# Patient Record
Sex: Male | Born: 1950 | Race: Black or African American | Marital: Married | Smoking: Never smoker
Health system: Southern US, Community
[De-identification: ages and names within clinical notes are randomized; demographics above are authoritative.]

## PROBLEM LIST (undated history)

## (undated) DIAGNOSIS — R7303 Prediabetes: Secondary | ICD-10-CM

## (undated) DIAGNOSIS — I10 Essential (primary) hypertension: Secondary | ICD-10-CM

## (undated) DIAGNOSIS — M1990 Unspecified osteoarthritis, unspecified site: Secondary | ICD-10-CM

## (undated) DIAGNOSIS — K759 Inflammatory liver disease, unspecified: Secondary | ICD-10-CM

## (undated) HISTORY — PX: COLONOSCOPY W/ POLYPECTOMY: SHX1380

## (undated) HISTORY — PX: OTHER SURGICAL HISTORY: SHX169

## (undated) HISTORY — PX: FRACTURE SURGERY: SHX138

## (undated) HISTORY — PX: SHOULDER OPEN ROTATOR CUFF REPAIR: SHX2407

---

## 2004-08-30 ENCOUNTER — Ambulatory Visit: Payer: Self-pay | Admitting: Internal Medicine

## 2004-10-31 ENCOUNTER — Ambulatory Visit: Payer: Self-pay | Admitting: Family Medicine

## 2004-11-10 ENCOUNTER — Ambulatory Visit: Payer: Self-pay | Admitting: Family Medicine

## 2004-11-17 ENCOUNTER — Ambulatory Visit: Payer: Self-pay | Admitting: Gastroenterology

## 2005-12-06 ENCOUNTER — Ambulatory Visit: Payer: Self-pay | Admitting: Unknown Physician Specialty

## 2009-02-08 ENCOUNTER — Ambulatory Visit: Payer: Self-pay | Admitting: General Practice

## 2009-04-26 ENCOUNTER — Ambulatory Visit: Payer: Self-pay

## 2010-09-30 IMAGING — CR DG LUMBAR SPINE 2-3V
1 series · 3 of 3 positions shown · non-contrast
Comparison: none

REASON FOR EXAM: a torn rotator cuff w/ rt shoulder  [facility] fax [phone]
COMMENTS:

[Series 1: view not recorded · 0.17mm/px · 3 of 3 slices shown]
[im 1/3]
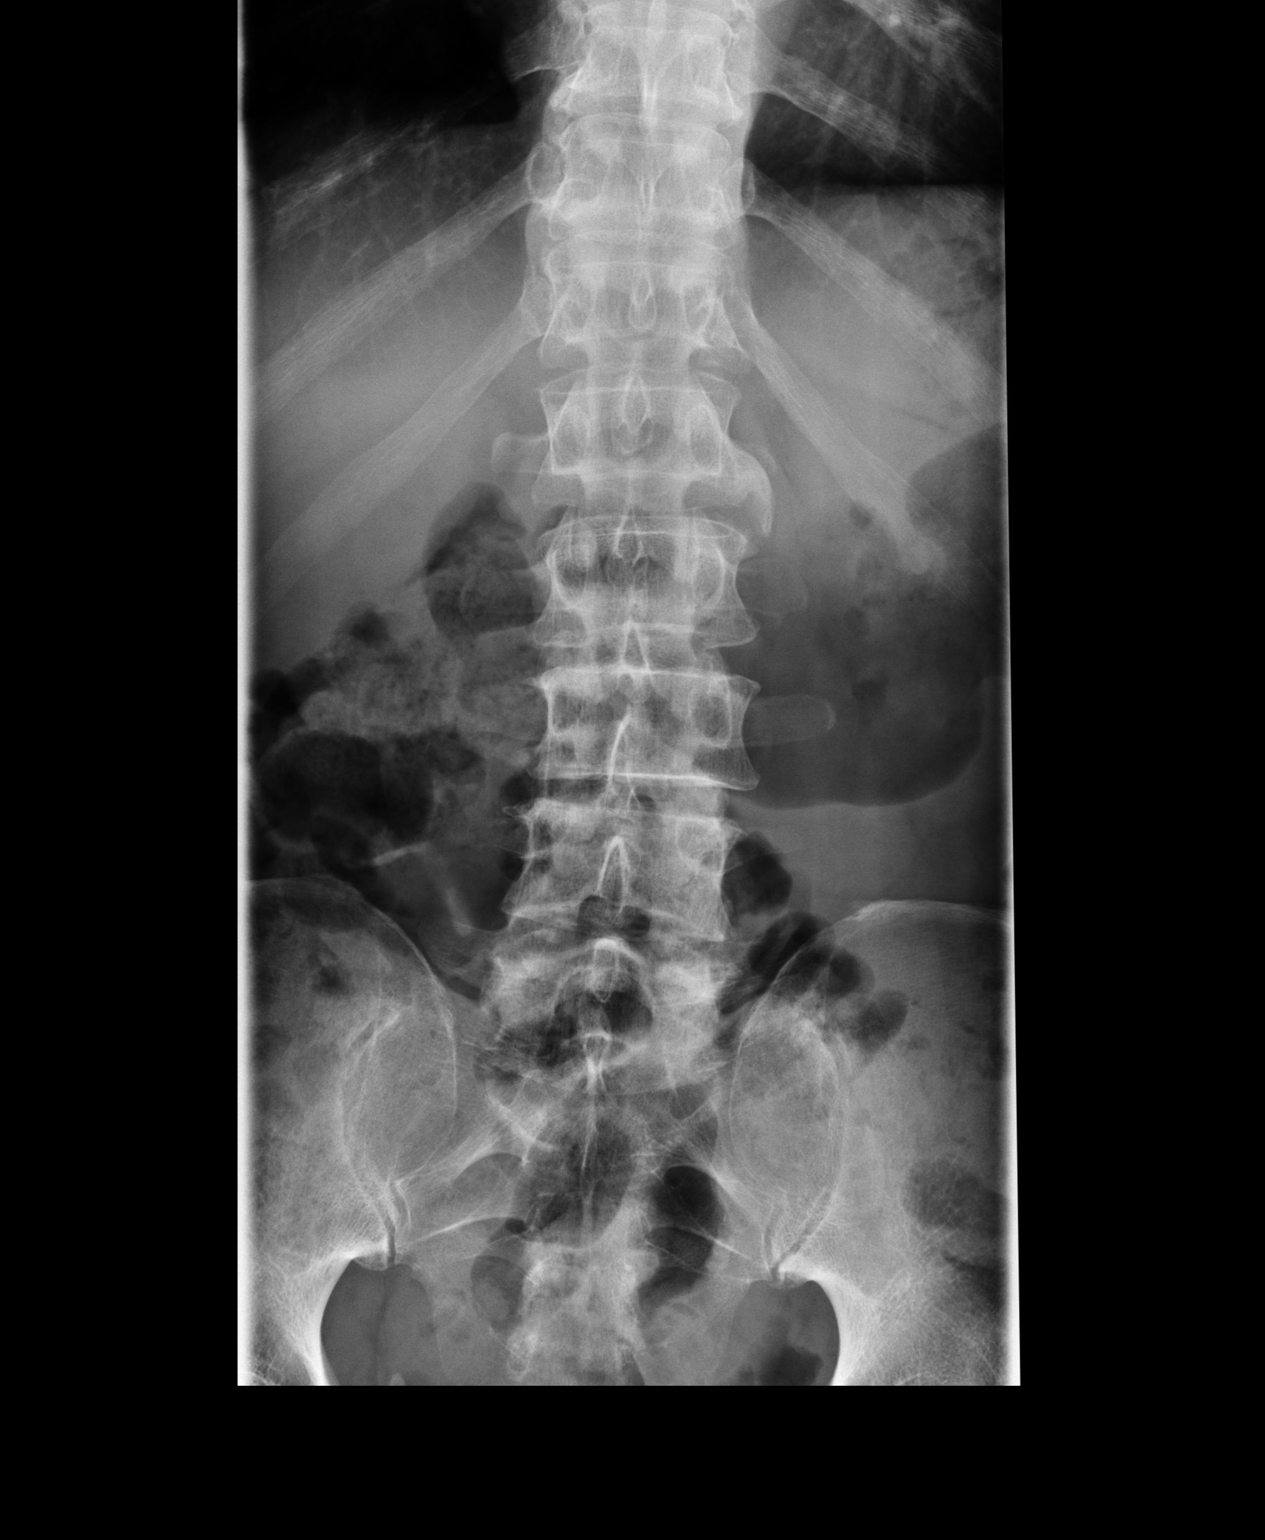
[im 2/3]
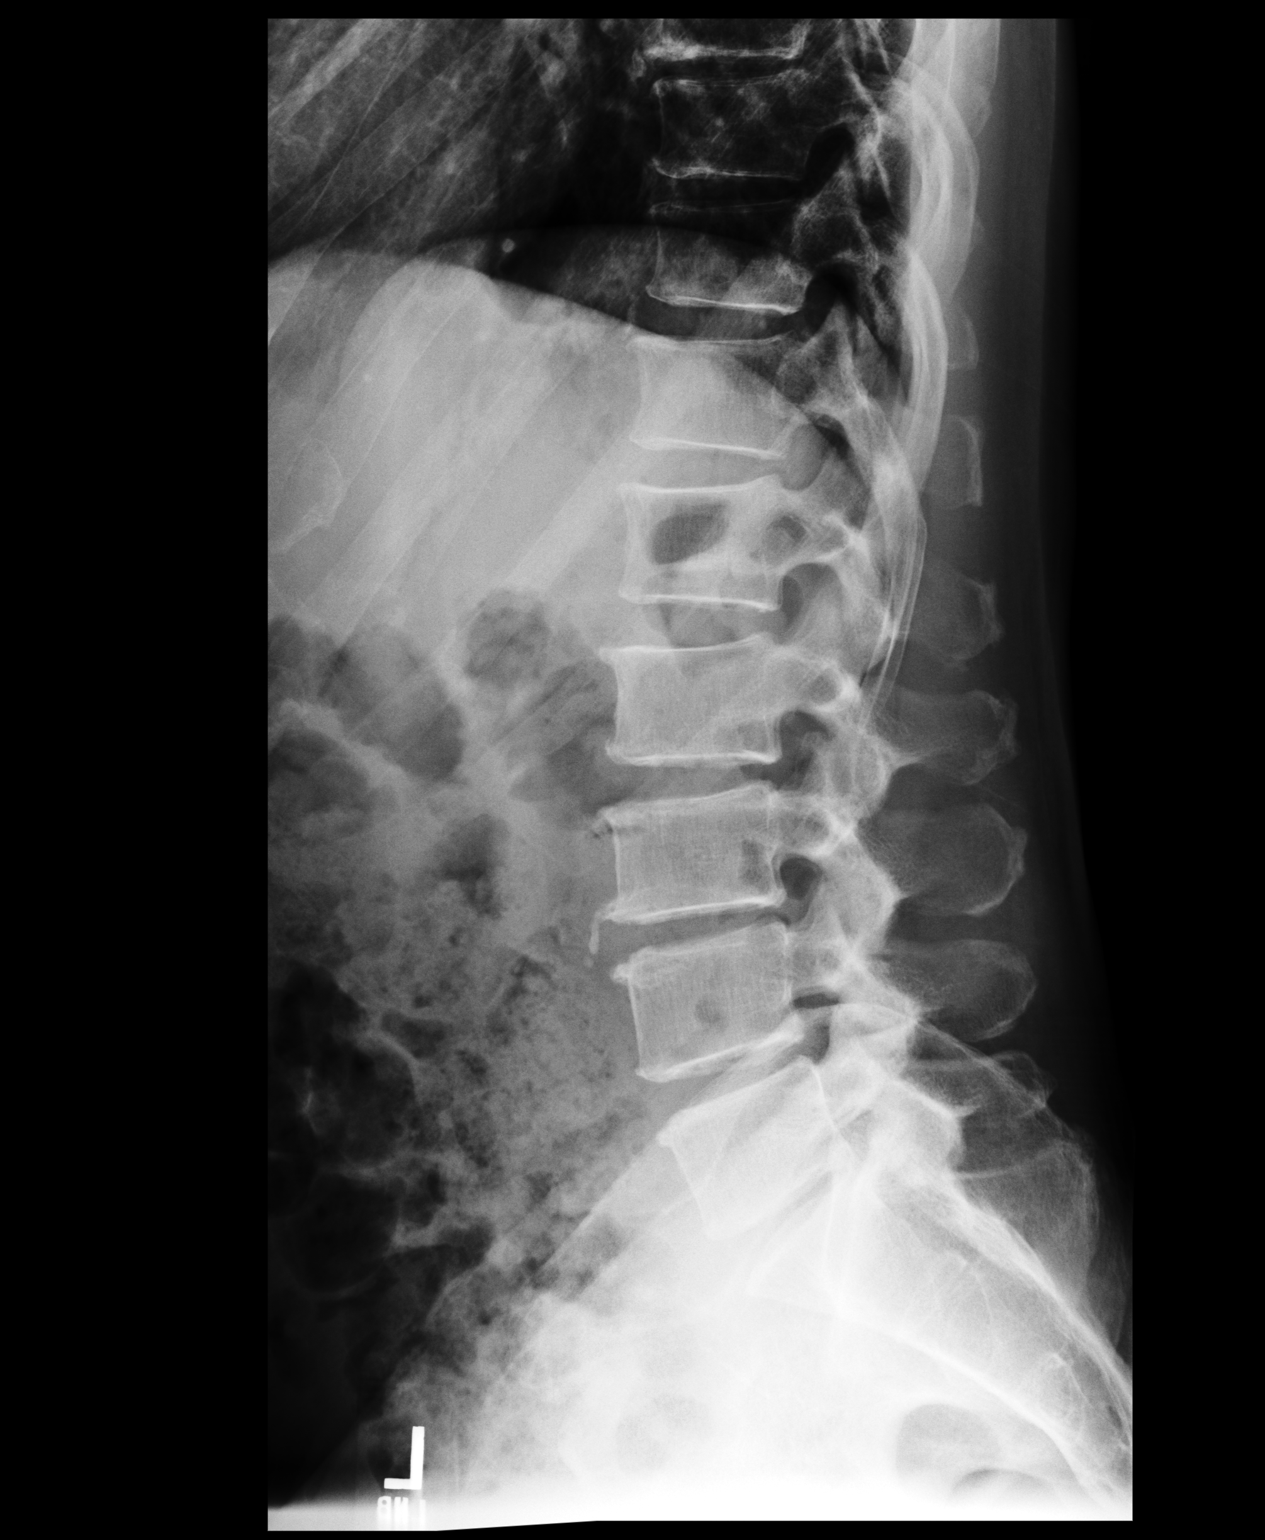
[im 3/3]
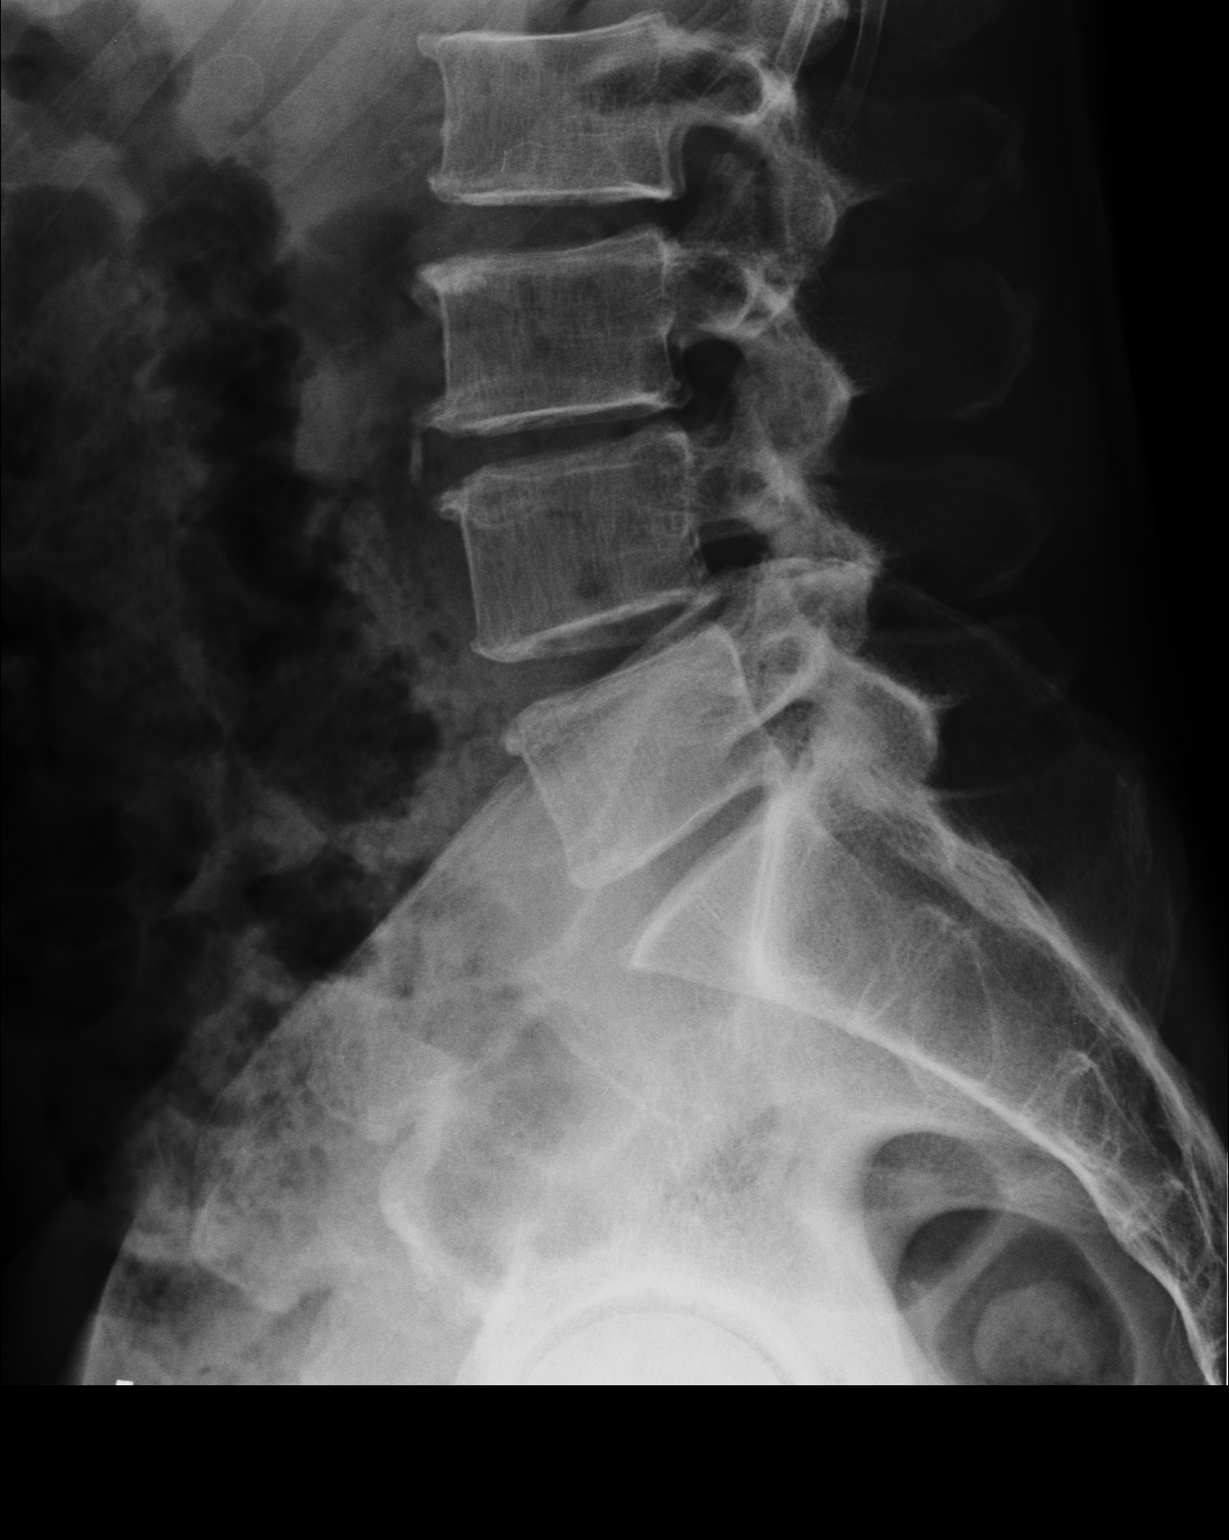

[3 of 3 positions shown; findings below may reference images not displayed]

PROCEDURE:     DXR - DXR LUMBAR SPINE AP AND LATERAL  - April 26, 2009 [DATE]

RESULT:     The vertebral body heights are well-maintained.  Vertebral body
alignment is normal. There is slight degenerative spurring anteriorly at
multiple levels. The pedicles are bilaterally intact. There is slight lumbar
scoliosis with a convexity to the left.
IMPRESSION: 1. No fracture or other acute change is identified.
2. There is a slight lumbar scoliosis with a convexity to the left.
3. Mild hypertrophic spurring is observed at multiple levels.

## 2018-03-21 ENCOUNTER — Emergency Department: Payer: Self-pay

## 2018-03-21 ENCOUNTER — Other Ambulatory Visit: Payer: Self-pay

## 2018-03-21 ENCOUNTER — Encounter: Payer: Self-pay | Admitting: Intensive Care

## 2018-03-21 ENCOUNTER — Emergency Department
Admission: EM | Admit: 2018-03-21 | Discharge: 2018-03-21 | Disposition: A | Discharge status: Home / Self Care | Payer: Self-pay | Attending: Emergency Medicine | Admitting: Emergency Medicine

## 2018-03-21 DIAGNOSIS — W208XXA Other cause of strike by thrown, projected or falling object, initial encounter: Secondary | ICD-10-CM | POA: Insufficient documentation

## 2018-03-21 DIAGNOSIS — S62655A Nondisplaced fracture of medial phalanx of left ring finger, initial encounter for closed fracture: Secondary | ICD-10-CM | POA: Insufficient documentation

## 2018-03-21 DIAGNOSIS — Y929 Unspecified place or not applicable: Secondary | ICD-10-CM | POA: Insufficient documentation

## 2018-03-21 DIAGNOSIS — Y998 Other external cause status: Secondary | ICD-10-CM | POA: Insufficient documentation

## 2018-03-21 DIAGNOSIS — Y939 Activity, unspecified: Secondary | ICD-10-CM | POA: Insufficient documentation

## 2018-03-21 MED ORDER — OXYCODONE-ACETAMINOPHEN 7.5-325 MG PO TABS: 1.0000 | ORAL_TABLET | Freq: Four times a day (QID) | ORAL | 0 refills | Status: DC | PRN

## 2018-03-21 MED ORDER — OXYCODONE-ACETAMINOPHEN 5-325 MG PO TABS
1.0000 | ORAL_TABLET | Freq: Once | ORAL | Status: AC
  Administered 2018-03-21: 1 via ORAL
  Filled 2018-03-21: qty 1

## 2018-03-21 NOTE — ED Notes (Signed)
L wrist pulse 2+; swollen at medial/posterior of hand; tender; fingers cool; cap refill <3 sec.
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
## 2018-03-21 NOTE — ED Notes (Signed)
provider at bedside with patient and patients family.

## 2018-03-21 NOTE — Discharge Instructions (Signed)
Wear splint until evaluation by orthopedics. °

## 2018-03-21 NOTE — ED Provider Notes (Signed)
[facility] Emergency Department Provider Note   ____________________________________________   First MD Initiated Contact with Patient 03/21/18 1507     (approximate)  I have reviewed the triage vital signs and the nursing notes.   HISTORY  Chief Complaint Hand Injury (left)    HPI Ronald Blackburn is a 67 y.o. male patient presents with left hand pain secondary to contusion.  Patient had a large limb fell on his hand.  Patient denies loss of sensation but state pain with flexion of the fourth digit left hand.  Patient rates pain as a 7/10.  Patient described the pain is "aching".  No palliative measures for complaint tried to arrival.  Ice pack was applied in triage.  History reviewed. No pertinent past medical history.  There are no active problems to display for this patient.   History reviewed. No pertinent surgical history.  Prior to Admission medications   Medication Sig Start Date End Date Taking? Authorizing Provider  oxyCODONE-acetaminophen (PERCOCET) 7.5-325 MG tablet Take 1 tablet by mouth every 6 (six) hours as needed. 03/21/18   Joni Reining, PA-C    Allergies Diclofenac  History reviewed. No pertinent family history.  Social History Social History   Tobacco Use  . Smoking status: Never Smoker  . Smokeless tobacco: Never Used  Substance Use Topics  . Alcohol use: Never    Frequency: Never  . Drug use: Never    Review of Systems Constitutional: No fever/chills Eyes: No visual changes. ENT: No sore throat. Cardiovascular: Denies chest pain. Respiratory: Denies shortness of breath. Gastrointestinal: No abdominal pain.  No nausea, no vomiting.  No diarrhea.  No constipation. Genitourinary: Negative for dysuria. Musculoskeletal: Left hand pain. Skin: Negative for rash. Neurological: Negative for headaches, focal weakness or numbness. Allergic/Immunilogical:  NSAIDs  ____________________________________________   PHYSICAL EXAM:  VITAL SIGNS: ED Triage Vitals  Enc Vitals Group     BP 03/21/18 1430 (!) 163/88     Pulse Rate 03/21/18 1430 77     Resp --      Temp 03/21/18 1430 98.5 F (36.9 C)     Temp Source 03/21/18 1430 Oral     SpO2 03/21/18 1430 98 %     Weight 03/21/18 1431 178 lb (80.7 kg)     Height 03/21/18 1431 5' 9.5" (1.765 m)     Head Circumference --      Peak Flow --      Pain Score 03/21/18 1441 7     Pain Loc --      Pain Edu? --      Excl. in GC? --     Constitutional: Alert and oriented. Well appearing and in no acute distress. Cardiovascular: Normal rate, regular rhythm. Grossly normal heart sounds.  Good peripheral circulation. Respiratory: Normal respiratory effort.  No retractions. Lungs CTAB. Musculoskeletal: Obvious edema to the dorsal aspect of the left hand.  No obvious deformity. Neurologic:  Normal speech and language. No gross focal neurologic deficits are appreciated. No gait instability. Skin:  Skin is warm, dry and intact. No rash noted. Psychiatric: Mood and affect are normal. Speech and behavior are normal.  ____________________________________________   LABS (all labs ordered are listed, but only abnormal results are displayed)  Labs Reviewed - No data to display ____________________________________________  EKG   ____________________________________________  RADIOLOGY  ED MD interpretation:    Official radiology report(s): Dg Hand Complete Left  Result Date: 03/21/2018 CLINICAL DATA:  67 y/o  M; land fell on  left hand with swelling. EXAM: LEFT HAND - COMPLETE 3+ VIEW COMPARISON:  None. FINDINGS: Acute mildly displaced fracture of the fourth proximal phalanx proximal diaphysis. No additional fracture or joint dislocation identified. Normal radiocarpal alignment. Mild osteoarthrosis of the basal joint with periarticular osteophytosis. IMPRESSION: Acute mildly displaced fracture of the  fourth proximal phalanx proximal diaphysis. Electronically Signed   By: Mitzi HansenLance  Furusawa-Stratton M.D.   On: 03/21/2018 15:26    ____________________________________________   PROCEDURES  Procedure(s) performed:   .Splint Application Date/Time: 03/21/2018 3:27 PM Performed by: Ricke HeyHooker, [place], NT Authorized by: Joni ReiningSmith, Ronald K, PA-C   Consent:    Consent obtained:  Verbal   Consent given by:  Patient   Risks discussed:  Numbness, pain and swelling Pre-procedure details:    Sensation:  Normal Procedure details:    Laterality:  Left   Location:  Hand   Hand:  L hand   Splint type:  Volar short arm   Supplies:  Ortho-Glass Post-procedure details:    Pain:  Unchanged   Sensation:  Normal   Patient tolerance of procedure:  Tolerated well, no immediate complications    Critical Care performed: No  ____________________________________________   INITIAL IMPRESSION / ASSESSMENT AND PLAN / ED COURSE  As part of my medical decision making, I reviewed the following data within the electronic MEDICAL RECORD NUMBER    Left hand pain secondary to fracture of the proximal fourth digit left hand.  Discussed x-ray findings with patient.  Patient placed in a splint given discharge care instruction.  Patient advised to follow orthopedics by calling for an appointment in 3 days.  Advised pain medication may cause drowsiness.      ____________________________________________   FINAL CLINICAL IMPRESSION(S) / ED DIAGNOSES  Final diagnoses:  Nondisplaced fracture of middle phalanx of left ring finger, initial encounter for closed fracture     ED Discharge Orders         Ordered    oxyCODONE-acetaminophen (PERCOCET) 7.5-325 MG tablet  Every 6 hours PRN     03/21/18 1543           Note:  This document was prepared using Dragon voice recognition software and may include unintentional dictation errors.    Joni ReiningSmith, Ronald K, PA-C 03/21/18 1546    Jeanmarie PlantMcShane, James A,  MD 03/21/18 [phone]

## 2019-10-15 ENCOUNTER — Emergency Department: Payer: No Typology Code available for payment source

## 2019-10-15 ENCOUNTER — Emergency Department
Admission: EM | Admit: 2019-10-15 | Discharge: 2019-10-15 | Disposition: A | Discharge status: Home / Self Care | Payer: No Typology Code available for payment source | Attending: Emergency Medicine | Admitting: Emergency Medicine

## 2019-10-15 ENCOUNTER — Other Ambulatory Visit: Payer: Self-pay

## 2019-10-15 DIAGNOSIS — Y999 Unspecified external cause status: Secondary | ICD-10-CM | POA: Diagnosis not present

## 2019-10-15 DIAGNOSIS — S161XXA Strain of muscle, fascia and tendon at neck level, initial encounter: Secondary | ICD-10-CM | POA: Insufficient documentation

## 2019-10-15 DIAGNOSIS — Y92410 Unspecified street and highway as the place of occurrence of the external cause: Secondary | ICD-10-CM | POA: Diagnosis not present

## 2019-10-15 DIAGNOSIS — I10 Essential (primary) hypertension: Secondary | ICD-10-CM | POA: Diagnosis not present

## 2019-10-15 DIAGNOSIS — Y93I9 Activity, other involving external motion: Secondary | ICD-10-CM | POA: Insufficient documentation

## 2019-10-15 DIAGNOSIS — S1090XA Unspecified superficial injury of unspecified part of neck, initial encounter: Secondary | ICD-10-CM | POA: Diagnosis present

## 2019-10-15 HISTORY — DX: Essential (primary) hypertension: I10

## 2019-10-15 MED ORDER — HYDROCODONE-ACETAMINOPHEN 5-325 MG PO TABS
1.0000 | ORAL_TABLET | Freq: Once | ORAL | Status: AC
  Administered 2019-10-15: 1 via ORAL
  Filled 2019-10-15: qty 1

## 2019-10-15 MED ORDER — HYDROCODONE-ACETAMINOPHEN 5-325 MG PO TABS: 1.0000 | ORAL_TABLET | Freq: Four times a day (QID) | ORAL | 0 refills | Status: AC | PRN

## 2019-10-15 NOTE — Discharge Instructions (Signed)
Follow-up with your primary care provider if any continued problems.  Also you need to take your ibuprofen which will help with your arthritic pain which will be worse tomorrow than it is currently.  A prescription for Norco was sent to your pharmacy if needed for moderate to severe pain.  Do not drive or operate machinery while taking this medication.  You can expect to be sore for approximately 4 to 5 days even with medication.
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
## 2019-10-19 ENCOUNTER — Ambulatory Visit
Admission: RE | Admit: 2019-10-19 | Discharge: 2019-10-19 | Disposition: A | Discharge status: Home / Self Care | Payer: No Typology Code available for payment source | Source: Ambulatory Visit | Attending: Chiropractor | Admitting: Chiropractor

## 2019-10-19 ENCOUNTER — Other Ambulatory Visit: Payer: Self-pay | Admitting: Chiropractor

## 2019-10-19 DIAGNOSIS — S338XXA Sprain of other parts of lumbar spine and pelvis, initial encounter: Secondary | ICD-10-CM | POA: Diagnosis not present

## 2019-10-19 DIAGNOSIS — S233XXA Sprain of ligaments of thoracic spine, initial encounter: Secondary | ICD-10-CM | POA: Insufficient documentation

## 2019-10-19 DIAGNOSIS — M75101 Unspecified rotator cuff tear or rupture of right shoulder, not specified as traumatic: Secondary | ICD-10-CM | POA: Diagnosis present
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
## 2021-09-14 ENCOUNTER — Ambulatory Visit: Payer: Self-pay | Admitting: General Surgery

## 2021-09-14 NOTE — H&P (Signed)
PATIENT PROFILE: ?Trevontae Lindahl is a 71 y.o. male who presents to the Clinic for consultation at the request of Dr. Rolinda Roan for evaluation of bilateral inguinal hernia. ? ?PCP:  Gabriel Carina, MD ? ?HISTORY OF PRESENT ILLNESS: ?Mr. Pomplun reports he has been feeling a bulge in his bilateral groin for the last few months.  He endorses that the bulge feels uncomfortable.  There is no significant pain.  There is no pain radiation.  There is no alleviating or aggravating factors.  He has been dealing with chronic back pain as well.  Pains radiates to the hips.  Denies any episode of abdominal distention nausea and vomiting.  Patient was previously evaluated a at the [place] [facility] and he was recommended to have bilateral inguinal hernia repair.  Due to the unavailability of having the surgery within a timely manner patient was referred to out of the system general surgery for earlier date evaluation. ? ? ?PROBLEM LIST: ?Bilateral inguinal hernia ? ?GENERAL REVIEW OF SYSTEMS:  ? ?General ROS: negative for - chills, fatigue, fever, weight gain or weight loss ?Allergy and Immunology ROS: negative for - hives  ?Hematological and Lymphatic ROS: negative for - bleeding problems or bruising, negative for palpable nodes ?Endocrine ROS: negative for - heat or cold intolerance, hair changes ?Respiratory ROS: negative for - cough, shortness of breath or wheezing ?Cardiovascular ROS: no chest pain or palpitations ?GI ROS: negative for nausea, vomiting, abdominal pain, diarrhea, constipation ?Musculoskeletal ROS: negative for - joint swelling or muscle pain ?Neurological ROS: negative for - confusion, syncope ?Dermatological ROS: negative for pruritus and rash ?Psychiatric: negative for anxiety, depression, difficulty sleeping and memory loss ? ?MEDICATIONS: ?Current Outpatient Medications  ?Medication Sig Dispense Refill  ? acetaminophen (TYLENOL) 325 MG tablet as needed    ? atorvastatin (LIPITOR) 80 MG tablet once daily    ?  calcium carbonate (TUMS E-X) 300 mg (750 mg) chewable tablet Take 300 mg of elemental by mouth every 2 (two) hours as needed for Heartburn    ? diclofenac (VOLTAREN) 1 % topical gel 2 (two) times daily    ? SENNA-FENNEL ORAL Take by mouth 2 (two) times daily    ? ?No current facility-administered medications for this visit.  ? ? ?ALLERGIES: ?Diclofenac ? ?PAST MEDICAL HISTORY: ?Past Medical History:  ?Diagnosis Date  ? Colon polyps 11/17/2004  ? adenomatous  ? GERD (gastroesophageal reflux disease)   ? HLD (hyperlipidemia)   ? ? ?PAST SURGICAL HISTORY: ?Past Surgical History:  ?Procedure Laterality Date  ? COLONOSCOPY  11/17/2004  ? PHx of adenomas polyp/FHx of Colon cancer-Brother/Repeat   ? ARTHROSCOPIC ROTATOR CUFF REPAIR Right   ? Dr Mayer Camel  ? finger surgery Left   ? ring finger  ? knee surgery Right   ? Dr Elfredia Nevins  ?  ? ?FAMILY HISTORY: ?Family History  ?Problem Relation Age of Onset  ? Colon polyps Mother   ? Colon cancer Brother   ?  ? ?SOCIAL HISTORY: ?Social History  ? ?Socioeconomic History  ? Marital status: Married  ?Tobacco Use  ? Smoking status: Former  ?  Years: 18.00  ?  Types: Cigarettes  ? Smokeless tobacco: Never  ?Vaping Use  ? Vaping Use: Some days  ?Substance and Sexual Activity  ? Alcohol use: Never  ? Drug use: Never  ? ? ?PHYSICAL EXAM: ?Vitals:  ? 09/14/21 1001  ?BP: (!) 160/88  ?Pulse: 68  ? ?Body mass index is 25.62 kg/m?. ?Weight: 79.8 kg (176 lb)  ? ?  GENERAL: Alert, active, oriented x3 ? ?HEENT: Pupils equal reactive to light. Extraocular movements are intact. Sclera clear. Palpebral conjunctiva normal red color.Pharynx clear. ? ?NECK: Supple with no palpable mass and no adenopathy. ? ?LUNGS: Sound clear with no rales rhonchi or wheezes. ? ?HEART: Regular rhythm S1 and S2 without murmur. ? ?ABDOMEN: Soft and depressible, nontender with no palpable mass, no hepatomegaly. Wounds dry and clean.  Bilateral left bigger than right, reducible inguinal hernias. ? ?EXTREMITIES: Well-developed  well-nourished symmetrical with no dependent edema. ? ?NEUROLOGICAL: Awake alert oriented, facial expression symmetrical, moving all extremities. ? ?REVIEW OF DATA: ?I have reviewed the following data today: ?No visits with results within 3 Month(s) from this visit.  ?Latest known visit with results is:  ?No results found for any previous visit.  ?  ? ?ASSESSMENT: ?Mr. Dobie is a 71 y.o. male presenting for consultation for bilateral inguinal hernia.   ? ?The patient presents with a symptomatic, reducible lateral inguinal hernia. Patient was oriented about the diagnosis of inguinal hernia and its implication. The patient was oriented about the treatment alternatives (observation vs surgical repair). Due to patient symptoms, repair is recommended. Patient oriented about the surgical procedure, the use of mesh and its risk of complications such as: infection, bleeding, injury to vas deference, vasculature and testicle, injury to bowel or bladder, and chronic pain.  ? ?Non-recurrent bilateral inguinal hernia without obstruction or gangrene [K40.20] ? ?PLAN: ?1.  Robotic assisted laparoscopic bilateral inguinal hernia repair with mesh (82423, 50) ?2.  CBC, CMP ?3.  Avoid taking any aspirin 5 days before surgery ?4.  Contact us if you have any concern ? ?Patient verbalized understanding, all questions were answered, and were agreeable with the plan outlined above.  ? ? ?Carolan Shiver, MD ? ?Electronically signed by Carolan Shiver, MD ? ?

## 2021-09-14 NOTE — H&P (View-Only) (Signed)
PATIENT PROFILE: ?Ronald Blackburn is a 71 y.o. male who presents to the Clinic for consultation at the request of Dr. Fashoyin for evaluation of bilateral inguinal hernia. ? ?PCP:  Bush, Haley Anne, MD ? ?HISTORY OF PRESENT ILLNESS: ?Ronald Blackburn reports he has been feeling a bulge in his bilateral groin for the last few months.  He endorses that the bulge feels uncomfortable.  There is no significant pain.  There is no pain radiation.  There is no alleviating or aggravating factors.  He has been dealing with chronic back pain as well.  Pains radiates to the hips.  Denies any episode of abdominal distention nausea and vomiting.  [facility] and [facility] was recommended to have bilateral inguinal hernia repair.  Due to the unavailability of having the surgery within a timely manner patient was referred to out of the system general surgery for earlier date evaluation. ? ? ?PROBLEM LIST: ?Bilateral inguinal hernia ? ?GENERAL REVIEW OF SYSTEMS:  ? ?General ROS: negative for - chills, fatigue, fever, weight gain or weight loss ?Allergy and Immunology ROS: negative for - hives  ?Hematological and Lymphatic ROS: negative for - bleeding problems or bruising, negative for palpable nodes ?Endocrine ROS: negative for - heat or cold intolerance, hair changes ?Respiratory ROS: negative for - cough, shortness of breath or wheezing ?Cardiovascular ROS: no chest pain or palpitations ?GI ROS: negative for nausea, vomiting, abdominal pain, diarrhea, constipation ?Musculoskeletal ROS: negative for - joint swelling or muscle pain ?Neurological ROS: negative for - confusion, syncope ?Dermatological ROS: negative for pruritus and rash ?Psychiatric: negative for anxiety, depression, difficulty sleeping and memory loss ? ?MEDICATIONS: ?Current Outpatient Medications  ?Medication Sig Dispense Refill  ? acetaminophen (TYLENOL) 325 MG tablet as needed    ? atorvastatin (LIPITOR) 80 MG tablet once daily    ?  calcium carbonate (TUMS E-X) 300 mg (750 mg) chewable tablet Take 300 mg of elemental by mouth every 2 (two) hours as needed for Heartburn    ? diclofenac (VOLTAREN) 1 % topical gel 2 (two) times daily    ? SENNA-FENNEL ORAL Take by mouth 2 (two) times daily    ? ?No current facility-administered medications for this visit.  ? ? ?ALLERGIES: ?Diclofenac ? ?PAST MEDICAL HISTORY: ?Past Medical History:  ?Diagnosis Date  ? Colon polyps 11/17/2004  ? adenomatous  ? GERD (gastroesophageal reflux disease)   ? HLD (hyperlipidemia)   ? ? ?PAST SURGICAL HISTORY: ?Past Surgical History:  ?Procedure Laterality Date  ? COLONOSCOPY  11/17/2004  ? PHx of adenomas polyp/FHx of Colon cancer-Brother/Repeat   ? ARTHROSCOPIC ROTATOR CUFF REPAIR Right   ? Dr Tatum  ? finger surgery Left   ? ring finger  ? knee surgery Right   ? Dr HB Kernodle  ?  ? ?FAMILY HISTORY: ?Family History  ?Problem Relation Age of Onset  ? Colon polyps Mother   ? Colon cancer Brother   ?  ? ?SOCIAL HISTORY: ?Social History  ? ?Socioeconomic History  ? Marital status: Married  ?Tobacco Use  ? Smoking status: Former  ?  Years: 18.00  ?  Types: Cigarettes  ? Smokeless tobacco: Never  ?Vaping Use  ? Vaping Use: Some days  ?Substance and Sexual Activity  ? Alcohol use: Never  ? Drug use: Never  ? ? ?PHYSICAL EXAM: ?Vitals:  ? 09/14/21 1001  ?BP: (!) 160/88  ?Pulse: 68  ? ?Body mass index is 25.62 kg/m?. ?Weight: 79.8 kg (176 lb)  ? ?  GENERAL: Alert, active, oriented x3 ? ?HEENT: Pupils equal reactive to light. Extraocular movements are intact. Sclera clear. Palpebral conjunctiva normal red color.Pharynx clear. ? ?NECK: Supple with no palpable mass and no adenopathy. ? ?LUNGS: Sound clear with no rales rhonchi or wheezes. ? ?HEART: Regular rhythm S1 and S2 without murmur. ? ?ABDOMEN: Soft and depressible, nontender with no palpable mass, no hepatomegaly. Wounds dry and clean.  Bilateral left bigger than right, reducible inguinal hernias. ? ?EXTREMITIES: Well-developed  well-nourished symmetrical with no dependent edema. ? ?NEUROLOGICAL: Awake alert oriented, facial expression symmetrical, moving all extremities. ? ?REVIEW OF DATA: ?I have reviewed the following data today: ?No visits with results within 3 Month(s) from this visit.  ?Latest known visit with results is:  ?No results found for any previous visit.  ?  ? ?ASSESSMENT: ?Ronald Blackburn is a 71 y.o. male presenting for consultation for bilateral inguinal hernia.   ? ?The patient presents with a symptomatic, reducible lateral inguinal hernia. Patient was oriented about the diagnosis of inguinal hernia and its implication. The patient was oriented about the treatment alternatives (observation vs surgical repair). Due to patient symptoms, repair is recommended. Patient oriented about the surgical procedure, the use of mesh and its risk of complications such as: infection, bleeding, injury to vas deference, vasculature and testicle, injury to bowel or bladder, and chronic pain.  ? ?Non-recurrent bilateral inguinal hernia without obstruction or gangrene [K40.20] ? ?PLAN: ?1.  Robotic assisted laparoscopic bilateral inguinal hernia repair with mesh (49650, 50) ?2.  CBC, CMP ?3.  Avoid taking any aspirin 5 days before surgery ?4.  Contact us if you have any concern ? ?Patient verbalized understanding, all questions were answered, and were agreeable with the plan outlined above.  ? ? ?Alexes Menchaca Cintron-Diaz, MD ? ?Electronically signed by Shermeka Rutt Cintron-Diaz, MD ? ?

## 2021-09-26 ENCOUNTER — Inpatient Hospital Stay
Admission: RE | Admit: 2021-09-26 | Discharge: 2021-09-26 | Disposition: A | Discharge status: Home / Self Care | Payer: Self-pay | Source: Ambulatory Visit

## 2021-09-26 NOTE — Patient Instructions (Signed)
Your procedure is scheduled on: 10/03/21 - Tuesday Report to the Registration Desk on the 1st floor of the [facility]. To find out your arrival time, please call [phone] between 1PM - 3PM on: Monday If your arrival time is 6:00 am, do not arrive prior to that time as the [facility] entrance doors do not open until 6:00 am.  REMEMBER: Instructions that are not followed completely may result in serious medical risk, up to and including death; or upon the discretion of your surgeon and anesthesiologist your surgery may need to be rescheduled.  Do not eat food or drink any fluids after midnight the night before surgery.  No gum chewing, lozengers or hard candies.  TAKE THESE MEDICATIONS THE MORNING OF SURGERY WITH A SIP OF WATER: NONE  One week prior to surgery: Stop Anti-inflammatories (NSAIDS) such as Advil, Aleve, Ibuprofen, Motrin, Naproxen, Naprosyn and Aspirin based products such as Excedrin, Goodys Powder, BC Powder.  Stop ANY OVER THE COUNTER supplements until after surgery.  You may however, continue to take Tylenol if needed for pain up until the day of surgery.  No Alcohol for 24 hours before or after surgery.  No Smoking including e-cigarettes for 24 hours prior to surgery.  No chewable tobacco products for at least 6 hours prior to surgery.  No nicotine patches on the day of surgery.  Do not use any "recreational" drugs for at least a week prior to your surgery.  Please be advised that the combination of cocaine and anesthesia may have negative outcomes, up to and including death. If you test positive for cocaine, your surgery will be cancelled.  On the morning of surgery brush your teeth with toothpaste and water, you may rinse your mouth with mouthwash if you wish. Do not swallow any toothpaste or mouthwash.  Use CHG Soap or wipes as directed on instruction sheet.  Do not wear jewelry, make-up, hairpins, clips or nail polish.  Do not wear lotions, powders,  or perfumes.   Do not shave body from the neck down 48 hours prior to surgery just in case you cut yourself which could leave a site for infection.  Also, freshly shaved skin may become irritated if using the CHG soap.  Contact lenses, hearing aids and dentures may not be worn into surgery.  Do not bring valuables to the hospital. [facility] is not responsible for any missing/lost belongings or valuables.   Notify your doctor if there is any change in your medical condition (cold, fever, infection).  Wear comfortable clothing (specific to your surgery type) to the hospital.  After surgery, you can help prevent lung complications by doing breathing exercises.  Take deep breaths and cough every 1-2 hours. Your doctor may order a device called an Incentive Spirometer to help you take deep breaths. When coughing or sneezing, hold a pillow firmly against your incision with both hands. This is called "splinting." Doing this helps protect your incision. It also decreases belly discomfort.  If you are being admitted to the hospital overnight, leave your suitcase in the car. After surgery it may be brought to your room.  If you are being discharged the day of surgery, you will not be allowed to drive home. You will need a responsible adult (18 years or older) to drive you home and stay with you that night.   If you are taking public transportation, you will need to have a responsible adult (18 years or older) with you. Please confirm with your  physician that it is acceptable to use public transportation.   Please call the Pre-admissions Testing Dept. at [phone] if you have any questions about these instructions.  Surgery Visitation Policy:  Patients undergoing a surgery or procedure may have two family members or support persons with them as long as the person is not COVID-19 positive or experiencing its symptoms.   Inpatient Visitation:    Visiting hours are 7 a.m. to 8 p.m. Up to  four visitors are allowed at one time in a patient room, including children. The visitors may rotate out with other people during the day. One designated support person (adult) may remain overnight.

## 2021-09-27 ENCOUNTER — Encounter
Admission: RE | Admit: 2021-09-27 | Discharge: 2021-09-27 | Disposition: A | Discharge status: Home / Self Care | Payer: Self-pay | Source: Ambulatory Visit | Attending: General Surgery | Admitting: General Surgery

## 2021-09-27 DIAGNOSIS — Z01812 Encounter for preprocedural laboratory examination: Secondary | ICD-10-CM

## 2021-09-27 HISTORY — DX: Inflammatory liver disease, unspecified: K75.9

## 2021-09-27 HISTORY — DX: Unspecified osteoarthritis, unspecified site: M19.90

## 2021-09-27 HISTORY — DX: Essential (primary) hypertension: I10

## 2021-09-27 HISTORY — DX: Prediabetes: R73.03

## 2021-09-28 ENCOUNTER — Encounter: Payer: Self-pay | Admitting: Urgent Care

## 2021-09-28 ENCOUNTER — Encounter
Admission: RE | Admit: 2021-09-28 | Discharge: 2021-09-28 | Disposition: A | Discharge status: Home / Self Care | Payer: No Typology Code available for payment source | Source: Ambulatory Visit | Attending: General Surgery | Admitting: General Surgery

## 2021-09-28 DIAGNOSIS — Z01812 Encounter for preprocedural laboratory examination: Secondary | ICD-10-CM

## 2021-09-28 DIAGNOSIS — Z01810 Encounter for preprocedural cardiovascular examination: Secondary | ICD-10-CM | POA: Insufficient documentation

## 2021-10-03 ENCOUNTER — Other Ambulatory Visit: Payer: Self-pay

## 2021-10-03 ENCOUNTER — Ambulatory Visit
Admission: RE | Admit: 2021-10-03 | Discharge: 2021-10-03 | Disposition: A | Discharge status: Home / Self Care | Payer: No Typology Code available for payment source | Attending: General Surgery | Admitting: General Surgery

## 2021-10-03 ENCOUNTER — Ambulatory Visit: Payer: No Typology Code available for payment source | Admitting: Anesthesiology

## 2021-10-03 ENCOUNTER — Encounter
Admission: RE | Disposition: A | Discharge status: Home / Self Care | Payer: Self-pay | Source: Home / Self Care | Attending: General Surgery

## 2021-10-03 ENCOUNTER — Encounter: Payer: Self-pay | Admitting: General Surgery

## 2021-10-03 DIAGNOSIS — G8929 Other chronic pain: Secondary | ICD-10-CM | POA: Diagnosis not present

## 2021-10-03 DIAGNOSIS — Z87891 Personal history of nicotine dependence: Secondary | ICD-10-CM | POA: Diagnosis not present

## 2021-10-03 DIAGNOSIS — K4020 Bilateral inguinal hernia, without obstruction or gangrene, not specified as recurrent: Secondary | ICD-10-CM | POA: Diagnosis not present

## 2021-10-03 DIAGNOSIS — I10 Essential (primary) hypertension: Secondary | ICD-10-CM | POA: Insufficient documentation

## 2021-10-03 HISTORY — PX: XI ROBOTIC ASSISTED INGUINAL HERNIA REPAIR WITH MESH: SHX6706

## 2021-10-03 SURGERY — REPAIR, HERNIA, INGUINAL, ROBOT-ASSISTED, LAPAROSCOPIC, USING MESH
Anesthesia: General | Site: Inguinal | Laterality: Bilateral

## 2021-10-03 MED ORDER — LACTATED RINGERS IV SOLN: INTRAVENOUS | Status: DC

## 2021-10-03 MED ORDER — ESMOLOL HCL 100 MG/10ML IV SOLN
INTRAVENOUS | Status: DC | PRN
  Administered 2021-10-03: 20 mg via INTRAVENOUS

## 2021-10-03 MED ORDER — OXYCODONE HCL 5 MG PO TABS: 5.0000 mg | ORAL_TABLET | Freq: Once | ORAL | Status: DC | PRN

## 2021-10-03 MED ORDER — EPHEDRINE SULFATE (PRESSORS) 50 MG/ML IJ SOLN
INTRAMUSCULAR | Status: DC | PRN
  Administered 2021-10-03: 10 mg via INTRAVENOUS
  Administered 2021-10-03: 5 mg via INTRAVENOUS

## 2021-10-03 MED ORDER — LIDOCAINE HCL (CARDIAC) PF 100 MG/5ML IV SOSY
PREFILLED_SYRINGE | INTRAVENOUS | Status: DC | PRN
  Administered 2021-10-03: 80 mg via INTRAVENOUS

## 2021-10-03 MED ORDER — SUGAMMADEX SODIUM 200 MG/2ML IV SOLN
INTRAVENOUS | Status: DC | PRN
  Administered 2021-10-03: 10 mg via INTRAVENOUS

## 2021-10-03 MED ORDER — DEXAMETHASONE SODIUM PHOSPHATE 10 MG/ML IJ SOLN
INTRAMUSCULAR | Status: DC | PRN
  Administered 2021-10-03: 10 mg via INTRAVENOUS

## 2021-10-03 MED ORDER — ACETAMINOPHEN 10 MG/ML IV SOLN
INTRAVENOUS | Status: AC
  Filled 2021-10-03: qty 100

## 2021-10-03 MED ORDER — CEFAZOLIN SODIUM-DEXTROSE 2-4 GM/100ML-% IV SOLN
2.0000 g | INTRAVENOUS | Status: AC
  Administered 2021-10-03: 2 g via INTRAVENOUS

## 2021-10-03 MED ORDER — FENTANYL CITRATE (PF) 100 MCG/2ML IJ SOLN
INTRAMUSCULAR | Status: AC
  Filled 2021-10-03: qty 2

## 2021-10-03 MED ORDER — ONDANSETRON HCL 4 MG/2ML IJ SOLN
INTRAMUSCULAR | Status: AC
  Filled 2021-10-03: qty 2

## 2021-10-03 MED ORDER — MIDAZOLAM HCL 2 MG/2ML IJ SOLN
INTRAMUSCULAR | Status: DC | PRN
  Administered 2021-10-03: 1 mg via INTRAVENOUS

## 2021-10-03 MED ORDER — FAMOTIDINE 20 MG PO TABS
ORAL_TABLET | ORAL | Status: AC
  Administered 2021-10-03: 20 mg via ORAL
  Filled 2021-10-03: qty 1

## 2021-10-03 MED ORDER — FENTANYL CITRATE (PF) 100 MCG/2ML IJ SOLN
INTRAMUSCULAR | Status: AC
Start: 2021-10-03 — End: ?
  Filled 2021-10-03: qty 2

## 2021-10-03 MED ORDER — 0.9 % SODIUM CHLORIDE (POUR BTL) OPTIME
TOPICAL | Status: DC | PRN
  Administered 2021-10-03: 500 mL

## 2021-10-03 MED ORDER — CEFAZOLIN SODIUM-DEXTROSE 2-4 GM/100ML-% IV SOLN
INTRAVENOUS | Status: AC
  Filled 2021-10-03: qty 100

## 2021-10-03 MED ORDER — LIDOCAINE HCL (PF) 2 % IJ SOLN
INTRAMUSCULAR | Status: AC
  Filled 2021-10-03: qty 5

## 2021-10-03 MED ORDER — OXYCODONE HCL 5 MG/5ML PO SOLN: 5.0000 mg | Freq: Once | ORAL | Status: DC | PRN

## 2021-10-03 MED ORDER — CHLORHEXIDINE GLUCONATE 0.12 % MT SOLN
OROMUCOSAL | Status: AC
  Administered 2021-10-03: 15 mL via OROMUCOSAL
  Filled 2021-10-03: qty 15

## 2021-10-03 MED ORDER — DEXMEDETOMIDINE (PRECEDEX) IN NS 20 MCG/5ML (4 MCG/ML) IV SYRINGE
PREFILLED_SYRINGE | INTRAVENOUS | Status: DC | PRN
  Administered 2021-10-03: 4 ug via INTRAVENOUS
  Administered 2021-10-03: 8 ug via INTRAVENOUS
  Administered 2021-10-03: 4 ug via INTRAVENOUS

## 2021-10-03 MED ORDER — ROCURONIUM BROMIDE 100 MG/10ML IV SOLN
INTRAVENOUS | Status: DC | PRN
  Administered 2021-10-03: 20 mg via INTRAVENOUS
  Administered 2021-10-03: 10 mg via INTRAVENOUS
  Administered 2021-10-03: 50 mg via INTRAVENOUS

## 2021-10-03 MED ORDER — ORAL CARE MOUTH RINSE: 15.0000 mL | Freq: Once | OROMUCOSAL | Status: AC

## 2021-10-03 MED ORDER — PROPOFOL 10 MG/ML IV BOLUS
INTRAVENOUS | Status: AC
  Filled 2021-10-03: qty 20

## 2021-10-03 MED ORDER — BUPIVACAINE-EPINEPHRINE (PF) 0.25% -1:200000 IJ SOLN
INTRAMUSCULAR | Status: AC
  Filled 2021-10-03: qty 30

## 2021-10-03 MED ORDER — PHENYLEPHRINE HCL-NACL 20-0.9 MG/250ML-% IV SOLN
INTRAVENOUS | Status: DC | PRN
  Administered 2021-10-03: 30 ug/min via INTRAVENOUS

## 2021-10-03 MED ORDER — LACTATED RINGERS IV SOLN: INTRAVENOUS | Status: DC | PRN

## 2021-10-03 MED ORDER — FENTANYL CITRATE (PF) 100 MCG/2ML IJ SOLN
INTRAMUSCULAR | Status: DC | PRN
  Administered 2021-10-03: 25 ug via INTRAVENOUS
  Administered 2021-10-03: 100 ug via INTRAVENOUS
  Administered 2021-10-03: 25 ug via INTRAVENOUS
  Administered 2021-10-03: 50 ug via INTRAVENOUS

## 2021-10-03 MED ORDER — ONDANSETRON HCL 4 MG/2ML IJ SOLN: 4.0000 mg | Freq: Once | INTRAMUSCULAR | Status: DC | PRN

## 2021-10-03 MED ORDER — GLYCOPYRROLATE 0.2 MG/ML IJ SOLN
INTRAMUSCULAR | Status: DC | PRN
  Administered 2021-10-03: .2 mg via INTRAVENOUS

## 2021-10-03 MED ORDER — PROPOFOL 10 MG/ML IV BOLUS
INTRAVENOUS | Status: DC | PRN
  Administered 2021-10-03: 150 mg via INTRAVENOUS

## 2021-10-03 MED ORDER — TRAMADOL HCL 50 MG PO TABS: 50.0000 mg | ORAL_TABLET | Freq: Four times a day (QID) | ORAL | 0 refills | Status: AC | PRN

## 2021-10-03 MED ORDER — PHENYLEPHRINE 80 MCG/ML (10ML) SYRINGE FOR IV PUSH (FOR BLOOD PRESSURE SUPPORT)
PREFILLED_SYRINGE | INTRAVENOUS | Status: DC | PRN
  Administered 2021-10-03 (×4): 80 ug via INTRAVENOUS

## 2021-10-03 MED ORDER — DEXAMETHASONE SODIUM PHOSPHATE 10 MG/ML IJ SOLN
INTRAMUSCULAR | Status: AC
  Filled 2021-10-03: qty 1

## 2021-10-03 MED ORDER — ROCURONIUM BROMIDE 10 MG/ML (PF) SYRINGE
PREFILLED_SYRINGE | INTRAVENOUS | Status: AC
  Filled 2021-10-03: qty 10

## 2021-10-03 MED ORDER — BUPIVACAINE-EPINEPHRINE 0.25% -1:200000 IJ SOLN
INTRAMUSCULAR | Status: DC | PRN
  Administered 2021-10-03: 8 mL
  Administered 2021-10-03: 22 mL

## 2021-10-03 MED ORDER — CHLORHEXIDINE GLUCONATE 0.12 % MT SOLN: 15.0000 mL | Freq: Once | OROMUCOSAL | Status: AC

## 2021-10-03 MED ORDER — ACETAMINOPHEN 10 MG/ML IV SOLN
INTRAVENOUS | Status: DC | PRN
  Administered 2021-10-03: 1000 mg via INTRAVENOUS

## 2021-10-03 MED ORDER — ONDANSETRON HCL 4 MG/2ML IJ SOLN
INTRAMUSCULAR | Status: DC | PRN
  Administered 2021-10-03: 4 mg via INTRAVENOUS

## 2021-10-03 MED ORDER — FENTANYL CITRATE (PF) 100 MCG/2ML IJ SOLN: 25.0000 ug | INTRAMUSCULAR | Status: DC | PRN

## 2021-10-03 MED ORDER — FAMOTIDINE 20 MG PO TABS: 20.0000 mg | ORAL_TABLET | Freq: Once | ORAL | Status: AC

## 2021-10-03 MED ORDER — EPHEDRINE 5 MG/ML INJ
INTRAVENOUS | Status: AC
  Filled 2021-10-03: qty 5

## 2021-10-03 MED ORDER — ACETAMINOPHEN 10 MG/ML IV SOLN: 1000.0000 mg | Freq: Once | INTRAVENOUS | Status: DC | PRN

## 2021-10-03 MED ORDER — MIDAZOLAM HCL 2 MG/2ML IJ SOLN
INTRAMUSCULAR | Status: AC
  Filled 2021-10-03: qty 2

## 2021-10-03 MED ORDER — FENTANYL CITRATE (PF) 100 MCG/2ML IJ SOLN
INTRAMUSCULAR | Status: AC
  Administered 2021-10-03: 25 ug via INTRAVENOUS
  Filled 2021-10-03: qty 2

## 2021-10-03 SURGICAL SUPPLY — 50 items
BAG INFUSER PRESSURE 100CC (MISCELLANEOUS) IMPLANT
BLADE SURG SZ11 CARB STEEL (BLADE) ×2 IMPLANT
COVER TIP SHEARS 8 DVNC (MISCELLANEOUS) ×1 IMPLANT
COVER TIP SHEARS 8MM DA VINCI (MISCELLANEOUS) ×1
COVER WAND RF STERILE (DRAPES) ×2 IMPLANT
DERMABOND ADVANCED (GAUZE/BANDAGES/DRESSINGS) ×1
DERMABOND ADVANCED .7 DNX12 (GAUZE/BANDAGES/DRESSINGS) ×1 IMPLANT
DRAPE ARM DVNC X/XI (DISPOSABLE) ×3 IMPLANT
DRAPE COLUMN DVNC XI (DISPOSABLE) ×1 IMPLANT
DRAPE DA VINCI XI ARM (DISPOSABLE) ×3
DRAPE DA VINCI XI COLUMN (DISPOSABLE) ×1
ELECT REM PT RETURN 9FT ADLT (ELECTROSURGICAL) ×2
ELECTRODE REM PT RTRN 9FT ADLT (ELECTROSURGICAL) ×1 IMPLANT
GLOVE BIO SURGEON STRL SZ 6.5 (GLOVE) ×5 IMPLANT
GLOVE BIOGEL PI IND STRL 6.5 (GLOVE) ×2 IMPLANT
GLOVE BIOGEL PI INDICATOR 6.5 (GLOVE) ×2
GOWN STRL REUS W/ TWL LRG LVL3 (GOWN DISPOSABLE) ×3 IMPLANT
GOWN STRL REUS W/TWL LRG LVL3 (GOWN DISPOSABLE) ×4
IRRIGATOR SUCT 8 DISP DVNC XI (IRRIGATION / IRRIGATOR) IMPLANT
IRRIGATOR SUCTION 8MM XI DISP (IRRIGATION / IRRIGATOR)
IV CATH ANGIO 12GX3 LT BLUE (NEEDLE) ×1 IMPLANT
KIT PINK PAD W/HEAD ARE REST (MISCELLANEOUS) ×2
KIT PINK PAD W/HEAD ARM REST (MISCELLANEOUS) ×1 IMPLANT
LABEL OR SOLS (LABEL) IMPLANT
MANIFOLD NEPTUNE II (INSTRUMENTS) ×2 IMPLANT
MESH 3DMAX 5X7 LT XLRG (Mesh General) ×1 IMPLANT
MESH 3DMAX 5X7 RT XLRG (Mesh General) ×1 IMPLANT
MESH 3DMAX MID 5X7 LT XLRG (Mesh General) IMPLANT
MESH 3DMAX MID 5X7 RT XLRG (Mesh General) IMPLANT
NDL INSUFFLATION 14GA 120MM (NEEDLE) ×1 IMPLANT
NEEDLE HYPO 22GX1.5 SAFETY (NEEDLE) ×2 IMPLANT
NEEDLE INSUFFLATION 14GA 120MM (NEEDLE) ×2 IMPLANT
NS IRRIG 500ML POUR BTL (IV SOLUTION) ×1 IMPLANT
OBTURATOR OPTICAL STANDARD 8MM (TROCAR) ×1
OBTURATOR OPTICAL STND 8 DVNC (TROCAR) ×1
OBTURATOR OPTICALSTD 8 DVNC (TROCAR) ×1 IMPLANT
PACK LAP CHOLECYSTECTOMY (MISCELLANEOUS) ×2 IMPLANT
SEAL CANN UNIV 5-8 DVNC XI (MISCELLANEOUS) ×3 IMPLANT
SEAL XI 5MM-8MM UNIVERSAL (MISCELLANEOUS) ×3
SET TUBE SMOKE EVAC HIGH FLOW (TUBING) ×2 IMPLANT
SOLUTION ELECTROLUBE (MISCELLANEOUS) ×2 IMPLANT
SUT MNCRL 4-0 (SUTURE) ×2
SUT MNCRL 4-0 27XMFL (SUTURE) ×2
SUT VIC AB 2-0 SH 27 (SUTURE) ×2
SUT VIC AB 2-0 SH 27XBRD (SUTURE) ×1 IMPLANT
SUT VIC AB 2-0 UR6 27 (SUTURE) ×1 IMPLANT
SUT VLOC 90 S/L VL9 GS22 (SUTURE) ×2 IMPLANT
SUTURE MNCRL 4-0 27XMF (SUTURE) ×1 IMPLANT
TAPE TRANSPORE STRL 2 31045 (GAUZE/BANDAGES/DRESSINGS) ×1 IMPLANT
TRAY FOLEY MTR SLVR 16FR STAT (SET/KITS/TRAYS/PACK) ×1 IMPLANT

## 2021-10-03 NOTE — Anesthesia Procedure Notes (Signed)
Procedure Name: Intubation Date/Time: 10/03/2021 12:34 PM Performed by: Tammi Klippel, CRNA Pre-anesthesia Checklist: Patient identified, Patient being monitored, Timeout performed, Emergency Drugs available and Suction available Patient Re-evaluated:Patient Re-evaluated prior to induction Oxygen Delivery Method: Circle system utilized Preoxygenation: Pre-oxygenation with 100% oxygen Induction Type: IV induction Ventilation: Mask ventilation without difficulty Laryngoscope Size: 3 and McGraph Grade View: Grade I Tube type: Oral Tube size: 7.0 mm Number of attempts: 1 Airway Equipment and Method: Stylet Placement Confirmation: ETT inserted through vocal cords under direct vision, positive ETCO2 and breath sounds checked- equal and bilateral Secured at: 22 cm Tube secured with: Tape Dental Injury: Teeth and Oropharynx as per pre-operative assessment

## 2021-10-03 NOTE — Anesthesia Preprocedure Evaluation (Signed)
Anesthesia Evaluation  Patient identified by MRN, date of birth, ID band Patient awake    Reviewed: Allergy & Precautions, NPO status , Patient's Chart, lab work & pertinent test results  History of Anesthesia Complications Negative for: history of anesthetic complications  Airway Mallampati: II  TM Distance: >3 FB Neck ROM: Full    Dental  (+) Partial Lower, Missing, Chipped   Pulmonary neg pulmonary ROS, neg sleep apnea, neg COPD, Patient abstained from smoking.Not current smoker,    Pulmonary exam normal breath sounds clear to auscultation       Cardiovascular Exercise Tolerance: Good METShypertension, (-) CAD and (-) Past MI (-) dysrhythmias  Rhythm:Regular Rate:Normal - Systolic murmurs    Neuro/Psych negative neurological ROS  negative psych ROS   GI/Hepatic neg GERD  ,(+)     (-) substance abuse  ,   Endo/Other  neg diabetes  Renal/GU negative Renal ROS     Musculoskeletal  (+) Arthritis ,   Abdominal   Peds  Hematology   Anesthesia Other Findings Past Medical History: No date: Arthritis No date: Hepatitis     Comment:  treated for Hep C in 2018 No date: Hypertension     Comment:  white coat sydrone, home check are WNL No date: Pre-diabetes No date: White coat syndrome with diagnosis of hypertension  Reproductive/Obstetrics                             Anesthesia Physical Anesthesia Plan  ASA: 2  Anesthesia Plan: General   Post-op Pain Management: Ofirmev IV (intra-op)* and Toradol IV (intra-op)*   Induction: Intravenous  PONV Risk Score and Plan: 4 or greater and Ondansetron, Dexamethasone and Midazolam  Airway Management Planned: Oral ETT  Additional Equipment: None  Intra-op Plan:   Post-operative Plan: Extubation in OR  Informed Consent: I have reviewed the patients History and Physical, chart, labs and discussed the procedure including the risks, benefits  and alternatives for the proposed anesthesia with the patient or authorized representative who has indicated his/her understanding and acceptance.     Dental advisory given  Plan Discussed with: CRNA and Surgeon  Anesthesia Plan Comments: (Discussed risks of anesthesia with patient, including PONV, sore throat, lip/dental/eye damage. Rare risks discussed as well, such as cardiorespiratory and neurological sequelae, and allergic reactions. Discussed the role of CRNA in patient's perioperative care. Patient understands.)        Anesthesia Quick Evaluation

## 2021-10-03 NOTE — Op Note (Signed)
Preoperative diagnosis: Bilateral inguinal hernia.   Postoperative diagnosis: Bilateral inguinal hernia.  Procedure: Robotic assisted Laparoscopic Transabdominal preperitoneal laparoscopic (TAPP) repair of bilateral inguinal hernia.  Anesthesia: GETA  Surgeon: Dr. Hazle Quant  Wound Classification: Clean  Indications:  Patient is a 71 y.o. male developed a symptomatic bilateral inguinal hernia. Repair was indicated.  Findings: 1.  Bilateral pantaloon inguinal hernia identified 2. Vas deferens and cord structures identified and preserved 3. Bard Extra Large 3D Max MID Anatomical mesh used for repair 4. Adequate hemostasis.   Description of procedure: The patient was taken to the operating room and the correct side of surgery was verified. The patient was placed supine with arms tucked at the sides. After obtaining adequate anesthesia, the patient's abdomen was prepped and draped in standard sterile fashion. The patient was placed in the Trendelenburg position. A time-out was completed verifying correct patient, procedure, site, positioning, and implant(s) and/or special equipment prior to beginning this procedure. A Veress needle was placed at the umbilicus and pneumoperitoneum created with insufflation of carbon dioxide to 15 mmHg. After the Veress needle was removed, an 8-mm trocar was placed on epigastric area and the 30 angled laparoscope inserted. Two 8-mm trocars were then placed lateral to the rectus sheath under direct visualization. Both inguinal regions were inspected and the median umbilical ligament, medial umbilical ligament, and lateral umbilical fold were identified.  The robotic arms were docked. The robotic scope was inserted and the pelvic area anatomy targeted.  Both sides were identified with pantaloon inguinal hernias.  Both sites where repair using the following technique.  The peritoneum was incised with scissors along a line 6 cm above the superior edge of the hernia  defect, extending from the median umbilical ligament to the anterior superior iliac spine. The peritoneal flap was mobilized inferiorly using blunt and sharp dissection. The inferior epigastric vessels were exposed and the pubic symphysis was identified. Cooper's ligament was dissected to its junction with the iliac vein. The dissection was continued inferiorly to the iliopubic tract, with care taken to avoid injury to the femoral branch of the genitofemoral nerve and the lateral femoral cutaneous nerve. The cord structures were parietalized. The hernia was identified and reduced by gentle traction.  The hernia sac was noted mobilized from the cord structures and reduced into the peritoneal cavity.  An extra large piece of mesh was rolled longitudinally into a compact cylinder and passed through a trocar. The cylinder was placed along the inferior aspect of the working space and unrolled into place to completely cover the direct, indirect, and femoral spaces. The mesh was secured into place superiorly to the anterior abdominal wall and inferiorly and medially to Cooper's ligament with absorbable sutures. Care was taken to avoid the inferolateral triangles containing the iliac vessels and genital nerves. The peritoneal flap was closed over the mesh and secured with suture in similar positions of safety. After ensuring adequate hemostasis, the trocars were removed and the pneumoperitoneum allowed to escape. The trocar incisions were closed using monocryl and skin adhesive dressings applied.  The patient tolerated the procedure well and was taken to the postanesthesia care unit in stable condition.   Specimen: None  Complications: None  Estimated Blood Loss: 5 mL

## 2021-10-03 NOTE — Discharge Instructions (Addendum)
  Diet: Resume home heart healthy regular diet.   Activity: No heavy lifting >20 pounds (children, pets, laundry, garbage) or strenuous activity until follow-up, but light activity and walking are encouraged. Do not drive or drink alcohol if taking narcotic pain medications.  Wound care: May shower with soapy water and pat dry (do not rub incisions), but no baths or submerging incision underwater until follow-up. (no swimming)   Medications: Resume all home medications. For mild to moderate pain: acetaminophen (Tylenol) ***or ibuprofen (if no kidney disease). Combining Tylenol with alcohol can substantially increase your risk of causing liver disease. Narcotic pain medications, if prescribed, can be used for severe pain, though may cause nausea, constipation, and drowsiness. Do not combine Tylenol and Norco within a 6 hour period as Norco contains Tylenol. If you do not need the narcotic pain medication, you do not need to fill the prescription.  Call office ([phone]) at any time if any questions, worsening pain, fevers/chills, bleeding, drainage from incision site, or other concerns.   AMBULATORY SURGERY  DISCHARGE INSTRUCTIONS   The drugs that you were given will stay in your system until tomorrow so for the next 24 hours you should not:  Drive an automobile Make any legal decisions Drink any alcoholic beverage   You may resume regular meals tomorrow.  Today it is better to start with liquids and gradually work up to solid foods.  You may eat anything you prefer, but it is better to start with liquids, then soup and crackers, and gradually work up to solid foods.   Please notify your doctor immediately if you have any unusual bleeding, trouble breathing, redness and pain at the surgery site, drainage, fever, or pain not relieved by medication.    Additional Instructions:        Please contact your physician with any problems or Same Day Surgery at [phone], Monday  through Friday 6 am to 4 pm, or [facility] number at [phone].  

## 2021-10-03 NOTE — Interval H&P Note (Signed)
History and Physical Interval Note:  10/03/2021 11:57 AM  Ronald Blackburn  has presented today for surgery, with the diagnosis of K40.20 --  non-recurrent bilateral inguinal hernia without obstruction or gangrene.  The various methods of treatment have been discussed with the patient and family. After consideration of risks, benefits and other options for treatment, the patient has consented to  Procedure(s): XI ROBOTIC ASSISTED INGUINAL HERNIA REPAIR WITH MESH (Bilateral) as a surgical intervention.  The patient's history has been reviewed, patient examined, no change in status, stable for surgery.  I have reviewed the patient's chart and labs.  Questions were answered to the patient's satisfaction.     Carolan Shiver

## 2021-10-03 NOTE — Transfer of Care (Signed)
Immediate Anesthesia Transfer of Care Note  Patient: Ronald Blackburn  Procedure(s) Performed: XI ROBOTIC ASSISTED INGUINAL HERNIA REPAIR WITH MESH (Bilateral: Inguinal)  Patient Location: PACU  Anesthesia Type:General  Level of Consciousness: drowsy  Airway & Oxygen Therapy: Patient Spontanous Breathing, Patient connected to face mask oxygen and oral airway in place, 71mm, airway patent, good respirations.  Post-op Assessment: Report given to RN and Post -op Vital signs reviewed and stable  Post vital signs: Reviewed and stable  Last Vitals:  Vitals Value Taken Time  BP 130/67 10/03/21 1501  Temp 36.3 C 10/03/21 1501  Pulse 74 10/03/21 1512  Resp 13 10/03/21 1512  SpO2 100 % 10/03/21 1512  Vitals shown include unvalidated device data.  Last Pain:  Vitals:   10/03/21 1210  TempSrc: Temporal  PainSc: 2          Complications: No notable events documented.

## 2021-10-04 ENCOUNTER — Encounter: Payer: Self-pay | Admitting: General Surgery

## 2021-10-04 NOTE — Anesthesia Postprocedure Evaluation (Signed)
Anesthesia Post Note  Patient: Ronald Blackburn  Procedure(s) Performed: XI ROBOTIC ASSISTED INGUINAL HERNIA REPAIR WITH MESH (Bilateral: Inguinal)  Patient location during evaluation: PACU Anesthesia Type: General Level of consciousness: awake and alert Pain management: pain level controlled Vital Signs Assessment: post-procedure vital signs reviewed and stable Respiratory status: spontaneous breathing, nonlabored ventilation and respiratory function stable Cardiovascular status: blood pressure returned to baseline and stable Postop Assessment: no apparent nausea or vomiting Anesthetic complications: no   No notable events documented.   Last Vitals:  Vitals:   10/03/21 1628 10/03/21 1630  BP: (!) 153/92 (!) 153/92  Pulse: 70 72  Resp: 12 12  Temp:    SpO2: 99% 96%    Last Pain:  Vitals:   10/03/21 1627  TempSrc: Temporal  PainSc: 3                  Foye Deer

## 2022-07-20 ENCOUNTER — Ambulatory Visit (INDEPENDENT_AMBULATORY_CARE_PROVIDER_SITE_OTHER): Payer: No Typology Code available for payment source | Admitting: Podiatry

## 2022-07-20 ENCOUNTER — Encounter: Payer: Self-pay | Admitting: Podiatry

## 2022-07-20 ENCOUNTER — Ambulatory Visit (INDEPENDENT_AMBULATORY_CARE_PROVIDER_SITE_OTHER): Payer: No Typology Code available for payment source

## 2022-07-20 VITALS — BP 168/92 | HR 67

## 2022-07-20 DIAGNOSIS — M7752 Other enthesopathy of left foot: Secondary | ICD-10-CM

## 2022-07-20 DIAGNOSIS — M7672 Peroneal tendinitis, left leg: Secondary | ICD-10-CM

## 2022-07-20 NOTE — Progress Notes (Signed)
Subjective:  Patient ID: Ronald Blackburn, male    DOB: 03-17-1951,  MRN: [id number]  Chief Complaint  Patient presents with   Foot Pain    "I have a bone spur and a cut tendon and some calcification is what they're saying." N - ankle pain L - lateral ankle D - 2.5 yrs O - suddenly got worse C - swelling, aching, sore A - walking [facility], Voltaren Gel, Tylenol, Ibuprofen, Diclofenac 75 mg    72 y.o. male presents with the above complaint.  Patient presents with complaint of left lateral ankle pain.  He states that he was seen at the [place] who was given him a brace and told to follow-up here.  He has been having this ankle pain for 2 and half years and suddenly got worse is aching more hurts with ambulation is tried MRIs x-rays Voltaren gel none of which has helped.  He has not brought his MRI with him.  He would like to discuss treatment options of surgical if needed.  He has not done cam boot immobilization or injection   Review of Systems: Negative except as noted in the HPI. Denies N/V/F/Ch.  Past Medical History:  Diagnosis Date   Arthritis    Hepatitis    treated for Hep C in 2018   Hypertension    white coat sydrone, home check are WNL   Pre-diabetes    White coat syndrome with diagnosis of hypertension     Current Outpatient Medications:    acetaminophen (TYLENOL) 500 MG tablet, Take 1,000 mg by mouth every 6 (six) hours as needed., Disp: , Rfl:    atorvastatin (LIPITOR) 80 MG tablet, Take 80 mg by mouth daily. At noon, Disp: , Rfl:    Multiple Vitamin (MULTIVITAMIN) tablet, Take 1 tablet by mouth daily., Disp: , Rfl:    tetrahydrozoline 0.05 % ophthalmic solution, Place 1 drop into both eyes as needed., Disp: , Rfl:    HYDROcodone-acetaminophen (NORCO/VICODIN) 5-325 MG tablet, Take 1 tablet by mouth every 6 (six) hours as needed for moderate pain. (Patient not taking: Reported on 09/27/2021), Disp: 12 tablet, Rfl: 0   traMADol (ULTRAM) 50 MG tablet, Take 1 tablet  (50 mg total) by mouth every 6 (six) hours as needed. (Patient not taking: Reported on 07/20/2022), Disp: 20 tablet, Rfl: 0  Social History   Tobacco Use  Smoking Status Never  Smokeless Tobacco Never    Allergies  Allergen Reactions   Diclofenac     Muscle cramps; has taken diclofenac gel, and other NSAIDs before without issue   Objective:   Vitals:   07/20/22 0741  BP: (!) 168/92  Pulse: 67   There is no height or weight on file to calculate BMI. Constitutional Well developed. Well nourished.  Vascular Dorsalis pedis pulses palpable bilaterally. Posterior tibial pulses palpable bilaterally. Capillary refill normal to all digits.  No cyanosis or clubbing noted. Pedal hair growth normal.  Neurologic Normal speech. Oriented to person, place, and time. Epicritic sensation to light touch grossly present bilaterally.  Dermatologic Nails well groomed and normal in appearance. No open wounds. No skin lesions.  Orthopedic: Pain on palpation to the left lateral peroneal tendon along the course of the tendon.  No pain at the insertion of the tendon.  Pain with resisted dorsiflexion eversion of the foot no pain with plantarflexion inversion of the foot.  No pain at the ATFL ligament.  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  Some osteoarthritis noted to the subtalar joint.  No bony abnormalities identified.  No fractures noted.  Posterior heel spurring noted. Assessment:   1. Peroneal tendinitis of left lower extremity    Plan:  Patient was evaluated and treated and all questions answered.  Left peroneal tendinitis -All questions and concerns were discussed with the patient in extensive detail given the amount of pain that he is having he will benefit from a steroid injection help decrease acute inflammatory component associate with pain.  He is unable to wear  cam boot because of his work.  He would like to proceed with steroid injection -I discussed the rupture risk of rupture associated with that he states understanding to proceed with steroid injection -A steroid injection was performed at left lateral foot using 1% plain Lidocaine and 10 mg of Kenalog. This was well tolerated. -Will attempt to get patient's MRI from [facility] for further management   No follow-ups on file.    Left peroneal tendinitis injection.  Patient is unable to wear a boot due to work.  [facility]  He had an MRI done we are requesting records

## 2022-08-24 ENCOUNTER — Ambulatory Visit (INDEPENDENT_AMBULATORY_CARE_PROVIDER_SITE_OTHER): Payer: No Typology Code available for payment source | Admitting: Podiatry

## 2022-08-24 ENCOUNTER — Telehealth: Payer: Self-pay

## 2022-08-24 DIAGNOSIS — M898X9 Other specified disorders of bone, unspecified site: Secondary | ICD-10-CM

## 2022-08-24 DIAGNOSIS — M7672 Peroneal tendinitis, left leg: Secondary | ICD-10-CM | POA: Diagnosis not present

## 2022-08-24 DIAGNOSIS — Z01818 Encounter for other preprocedural examination: Secondary | ICD-10-CM | POA: Diagnosis not present

## 2022-08-24 NOTE — Progress Notes (Unsigned)
Subjective:  Patient ID: Ronald Blackburn, male    DOB: [birth year],  MRN: [id number]  Chief Complaint  Patient presents with   Foot Pain    Pt stated that he has some discomfort     72 y.o. male presents with the above complaint.  Patient presents with complaint of left lateral ankle pain.  He states that he was seen at the [place] who was given him a brace and told to follow-up here.  He has been having this ankle pain for 2 and half years and suddenly got worse is aching more hurts with ambulation is tried MRIs x-rays Voltaren gel none of which has helped.  He has brought his MRI with him.  He is here to go over the results and discuss surgical options.  He is not a diabetic  Review of Systems: Negative except as noted in the HPI. Denies N/V/F/Ch.  Past Medical History:  Diagnosis Date   Arthritis    Hepatitis    treated for Hep C in 2018   Hypertension    white coat sydrone, home check are WNL   Pre-diabetes    White coat syndrome with diagnosis of hypertension     Current Outpatient Medications:    acetaminophen (TYLENOL) 500 MG tablet, Take 1,000 mg by mouth every 6 (six) hours as needed., Disp: , Rfl:    atorvastatin (LIPITOR) 80 MG tablet, Take 80 mg by mouth daily. At noon, Disp: , Rfl:    HYDROcodone-acetaminophen (NORCO/VICODIN) 5-325 MG tablet, Take 1 tablet by mouth every 6 (six) hours as needed for moderate pain. (Patient not taking: Reported on 09/27/2021), Disp: 12 tablet, Rfl: 0   Multiple Vitamin (MULTIVITAMIN) tablet, Take 1 tablet by mouth daily., Disp: , Rfl:    tetrahydrozoline 0.05 % ophthalmic solution, Place 1 drop into both eyes as needed., Disp: , Rfl:    traMADol (ULTRAM) 50 MG tablet, Take 1 tablet (50 mg total) by mouth every 6 (six) hours as needed. (Patient not taking: Reported on 07/20/2022), Disp: 20 tablet, Rfl: 0  Social History   Tobacco Use  Smoking Status Never  Smokeless Tobacco Never    Allergies  Allergen Reactions   Diclofenac     Muscle  cramps; has taken diclofenac gel, and other NSAIDs before without issue   Objective:   There were no vitals filed for this visit.  There is no height or weight on file to calculate BMI. Constitutional Well developed. Well nourished.  Vascular Dorsalis pedis pulses palpable bilaterally. Posterior tibial pulses palpable bilaterally. Capillary refill normal to all digits.  No cyanosis or clubbing noted. Pedal hair growth normal.  Neurologic Normal speech. Oriented to person, place, and time. Epicritic sensation to light touch grossly present bilaterally.  Dermatologic Nails well groomed and normal in appearance. No open wounds. No skin lesions.  Orthopedic: Pain on palpation to the left lateral peroneal tendon along the course of the tendon.  No pain at the insertion of the tendon.  Pain with resisted dorsiflexion eversion of the foot no pain with plantarflexion inversion of the foot.  No pain at the ATFL ligament.  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  Some osteoarthritis noted to the subtalar joint.  No bony abnormalities identified.  No fractures noted.  Posterior heel spurring noted. Assessment:   No diagnosis found.  Plan:  Patient was evaluated and treated and all questions answered.  Left peroneal tendinitis -All questions and concerns were discussed with the patient in extensive detail given the amount of pain that he is having he will benefit from a steroid injection help decrease acute inflammatory component associate with pain.  He is unable to wear cam boot because of his work.  He would like to proceed with steroid injection -I discussed the rupture risk of rupture associated with that he states understanding to proceed with steroid injection -On no other steroid injection was performed at left lateral foot using 1% plain Lidocaine and 10 mg of  Kenalog. This was well tolerated. -MRI was reviewed which shows peroneal tendon tear along with calcaneal tubercle/peroneal tubercle hypertrophy.  I discussed this with the patient in extensive detail given the amount of pain that he is experiencing and has failed all conservative care including shoe's injection padding protecting offloading he will benefit from peroneal tendon repair with exostectomy of the peroneal tubercle/calcaneal tubercle.  I discussed my preoperative intra postop plan with the patient in extensive detail he states understanding and would like to proceed with surgery -Informed surgical risk consent was reviewed and read aloud to the patient.  I reviewed the films.  I have discussed my findings with the patient in great detail.  I have discussed all risks including but not limited to infection, stiffness, scarring, limp, disability, deformity, damage to blood vessels and nerves, numbness, poor healing, need for braces, arthritis, chronic pain, amputation, death.  All benefits and realistic expectations discussed in great detail.  I have made no promises as to the outcome.  I have provided realistic expectations.  I have offered the patient a 2nd opinion, which they have declined and assured me they preferred to proceed despite the risks    No follow-ups on file.    Left peroneal tendinitis injection.  Patient is unable to wear a boot due to work.  [facility]  He had an MRI done we are requesting records  Left peroneal tendinitis with tendinosis and partial tearing surgery with peroneal tubercle exostectomy  Injection was given  Given the cam boot to cam boot was given to  He brought over MRI disc and paperwork

## 2022-08-24 NOTE — Telephone Encounter (Signed)
Received surgery paperwork from the [place] office. Left a message for [place] to call and schedule surgery with Dr. Allena Katz.

## 2022-10-08 ENCOUNTER — Other Ambulatory Visit: Payer: Self-pay | Admitting: Podiatry

## 2022-10-08 ENCOUNTER — Encounter: Payer: Self-pay | Admitting: Podiatry

## 2022-10-08 DIAGNOSIS — M25775 Osteophyte, left foot: Secondary | ICD-10-CM

## 2022-10-08 DIAGNOSIS — M7672 Peroneal tendinitis, left leg: Secondary | ICD-10-CM

## 2022-10-08 MED ORDER — OXYCODONE-ACETAMINOPHEN 5-325 MG PO TABS: 1.0000 | ORAL_TABLET | ORAL | 0 refills | Status: AC | PRN

## 2022-10-08 MED ORDER — IBUPROFEN 800 MG PO TABS: 800.0000 mg | ORAL_TABLET | Freq: Four times a day (QID) | ORAL | 1 refills | Status: AC | PRN

## 2022-10-16 ENCOUNTER — Ambulatory Visit (INDEPENDENT_AMBULATORY_CARE_PROVIDER_SITE_OTHER): Payer: No Typology Code available for payment source | Admitting: Podiatry

## 2022-10-16 DIAGNOSIS — M7672 Peroneal tendinitis, left leg: Secondary | ICD-10-CM

## 2022-10-16 NOTE — Progress Notes (Signed)
  Subjective:  Patient ID: Ronald Blackburn, male    DOB: 1950-07-15,  MRN: [id number]  Chief Complaint  Patient presents with   Routine Post Op    POV #1 DOS 10/08/2022 LT PERONEAL TENDON DEBRIDMENT WITH REPAIR WITH POSS RESECTION OF PERONEAL TUBERCLE    DOS: 10/08/2022 Procedure: Left peroneal tendon debridement with resection of peroneal tubercle  72 y.o. male returns for post-op check.  Patient states that he is doing well.  Bandages clean dry and intact.  Nonweightbearing to the left lower extremity  Review of Systems: Negative except as noted in the HPI. Denies N/V/F/Ch.  Past Medical History:  Diagnosis Date   Arthritis    Hepatitis    treated for Hep C in 2018   Hypertension    white coat sydrone, home check are WNL   Pre-diabetes    White coat syndrome with diagnosis of hypertension     Current Outpatient Medications:    acetaminophen (TYLENOL) 500 MG tablet, Take 1,000 mg by mouth every 6 (six) hours as needed., Disp: , Rfl:    atorvastatin (LIPITOR) 80 MG tablet, Take 80 mg by mouth daily. At noon, Disp: , Rfl:    HYDROcodone-acetaminophen (NORCO/VICODIN) 5-325 MG tablet, Take 1 tablet by mouth every 6 (six) hours as needed for moderate pain. (Patient not taking: Reported on 09/27/2021), Disp: 12 tablet, Rfl: 0   ibuprofen (ADVIL) 800 MG tablet, Take 1 tablet (800 mg total) by mouth every 6 (six) hours as needed., Disp: 60 tablet, Rfl: 1   Multiple Vitamin (MULTIVITAMIN) tablet, Take 1 tablet by mouth daily., Disp: , Rfl:    oxyCODONE-acetaminophen (PERCOCET) 5-325 MG tablet, Take 1 tablet by mouth every 4 (four) hours as needed for severe pain., Disp: 30 tablet, Rfl: 0   tetrahydrozoline 0.05 % ophthalmic solution, Place 1 drop into both eyes as needed., Disp: , Rfl:   Social History   Tobacco Use  Smoking Status Never  Smokeless Tobacco Never    Allergies  Allergen Reactions   Diclofenac     Muscle cramps; has taken diclofenac gel, and other NSAIDs before without  issue   Objective:  There were no vitals filed for this visit. There is no height or weight on file to calculate BMI. Constitutional Well developed. Well nourished.  Vascular Foot warm and well perfused. Capillary refill normal to all digits.   Neurologic Normal speech. Oriented to person, place, and time. Epicritic sensation to light touch grossly present bilaterally.  Dermatologic Skin healing well without signs of infection. Skin edges well coapted without signs of infection.  Orthopedic: Tenderness to palpation noted about the surgical site.   Radiographs: None Assessment:   1. Peroneal tendinitis of left lower extremity    Plan:  Patient was evaluated and treated and all questions answered.  S/p foot surgery left -Progressing as expected post-operatively. -XR: See above -WB Status: Nonweightbearing in left lower extremity -Sutures: None. -Medications: None -Foot redressed.  No follow-ups on file.

## 2022-10-30 ENCOUNTER — Encounter: Payer: No Typology Code available for payment source | Admitting: Podiatry

## 2022-10-30 ENCOUNTER — Ambulatory Visit (INDEPENDENT_AMBULATORY_CARE_PROVIDER_SITE_OTHER): Payer: No Typology Code available for payment source | Admitting: Podiatry

## 2022-10-30 DIAGNOSIS — Z9889 Other specified postprocedural states: Secondary | ICD-10-CM

## 2022-10-30 NOTE — Progress Notes (Signed)
   Chief Complaint  Patient presents with   Routine Post Op    POV #2 DOS 10/08/2022 LT PERONEAL TENDON DEBRIDMENT WITH REPAIR WITH POSS RESECTION OF PERONEAL TUBERCLE, Sutures removed today, no N/V/F/C/SOB, rate of pain 5 out of 10     Subjective:  Patient presents today status post left peroneal tendon repair.  DOS: 10/08/2022.  Dr. Nicholes Rough.  Patient doing well.  Dressings are clean dry and intact.  Minimal WBAT in the cam boot with the assistance of crutches.  Past Medical History:  Diagnosis Date   Arthritis    Hepatitis    treated for Hep C in 2018   Hypertension    white coat sydrone, home check are WNL   Pre-diabetes    White coat syndrome with diagnosis of hypertension     Past Surgical History:  Procedure Laterality Date   COLONOSCOPY W/ POLYPECTOMY     x 3   FRACTURE SURGERY     left hand ring finger   knee arthroscopic Left    SHOULDER OPEN ROTATOR CUFF REPAIR     XI ROBOTIC ASSISTED INGUINAL HERNIA REPAIR WITH MESH Bilateral 10/03/2021   Procedure: XI ROBOTIC ASSISTED INGUINAL HERNIA REPAIR WITH MESH;  Surgeon: Carolan Shiver, MD;  Location: [facility] ORS;  Service: General;  Laterality: Bilateral;    Allergies  Allergen Reactions   Diclofenac     Muscle cramps; has taken diclofenac gel, and other NSAIDs before without issue    Objective/Physical Exam Neurovascular status intact.  Incision well coapted with staples intact. No sign of infectious process noted. No dehiscence. No active bleeding noted.  Moderate edema noted to the surgical extremity.   Assessment: 1. s/p peroneal tendon repair left. DOS: 10/08/2022   Plan of Care:  -Patient was evaluated.  -Staples removed -Light dressing applied. -Recommend Ace wrap daily -Continue minimal WBAT cam boot with the assistance of crutches -Return to clinic 2 weeks with Dr. Boyd Kerbs, DPM [facility]  Dr. Felecia Shelling, DPM    [address] [place], [place] [address]                Office [phone]  Fax [phone]

## 2022-11-01 ENCOUNTER — Telehealth: Payer: Self-pay | Admitting: Podiatry

## 2022-11-01 ENCOUNTER — Encounter: Payer: Self-pay | Admitting: Podiatry

## 2022-11-01 NOTE — Telephone Encounter (Signed)
Pt called asking about getting a note requarding restrictions for work. Please advise he would like to pick up in [place] office tomorrow.(6.28.2024)

## 2022-11-01 NOTE — Telephone Encounter (Signed)
Called pt let him know his note was ready for pick up. 

## 2022-11-15 ENCOUNTER — Ambulatory Visit (INDEPENDENT_AMBULATORY_CARE_PROVIDER_SITE_OTHER): Payer: No Typology Code available for payment source | Admitting: Podiatry

## 2022-11-15 DIAGNOSIS — M7672 Peroneal tendinitis, left leg: Secondary | ICD-10-CM

## 2022-11-15 DIAGNOSIS — Z9889 Other specified postprocedural states: Secondary | ICD-10-CM

## 2022-11-15 MED ORDER — OXYCODONE-ACETAMINOPHEN 5-325 MG PO TABS: 1.0000 | ORAL_TABLET | ORAL | 0 refills | Status: AC | PRN

## 2022-11-15 NOTE — Progress Notes (Signed)
   Chief Complaint  Patient presents with   Routine Post Op    POV #3 DOS 10/08/2022 LT PERONEAL TENDON DEBRIDMENT WITH REPAIR WITH POSS RESECTION OF PERONEAL TUBERCLE       Subjective:  Patient presents today status post left peroneal tendon repair.  DOS: 10/08/2022.  Dr. Nicholes Rough.  He states he is doing well.  Nonweightbearing to the cam boot.  He still needs some crutches and he is doing some weightbearing as tolerated  Past Medical History:  Diagnosis Date   Arthritis    Hepatitis    treated for Hep C in 2018   Hypertension    white coat sydrone, home check are WNL   Pre-diabetes    White coat syndrome with diagnosis of hypertension     Past Surgical History:  Procedure Laterality Date   COLONOSCOPY W/ POLYPECTOMY     x 3   FRACTURE SURGERY     left hand ring finger   knee arthroscopic Left    SHOULDER OPEN ROTATOR CUFF REPAIR     XI ROBOTIC ASSISTED INGUINAL HERNIA REPAIR WITH MESH Bilateral 10/03/2021   Procedure: XI ROBOTIC ASSISTED INGUINAL HERNIA REPAIR WITH MESH;  Surgeon: Carolan Shiver, MD;  Location: [facility] ORS;  Service: General;  Laterality: Bilateral;    Allergies  Allergen Reactions   Diclofenac     Muscle cramps; has taken diclofenac gel, and other NSAIDs before without issue    Objective/Physical Exam Neurovascular status intact.  Incision well coapted with staples intact. No sign of infectious process noted. No dehiscence. No active bleeding noted.  Moderate edema noted to the surgical extremity.   Assessment: 1. s/p peroneal tendon repair left. DOS: 10/08/2022   Plan of Care:  -Patient was evaluated.  -Staples removed -Light dressing applied. -Recommend Ace wrap daily -Continue minimal WBAT cam boot with the assistance of crutches - Will start physical therapy with nonweightbearing exercises.  Prescription for physical therapy was given

## 2022-12-25 ENCOUNTER — Ambulatory Visit (INDEPENDENT_AMBULATORY_CARE_PROVIDER_SITE_OTHER): Payer: No Typology Code available for payment source | Admitting: Podiatry

## 2022-12-25 ENCOUNTER — Encounter: Payer: Self-pay | Admitting: Podiatry

## 2022-12-25 DIAGNOSIS — M779 Enthesopathy, unspecified: Secondary | ICD-10-CM | POA: Insufficient documentation

## 2022-12-25 DIAGNOSIS — R7303 Prediabetes: Secondary | ICD-10-CM | POA: Insufficient documentation

## 2022-12-25 DIAGNOSIS — K8020 Calculus of gallbladder without cholecystitis without obstruction: Secondary | ICD-10-CM | POA: Insufficient documentation

## 2022-12-25 DIAGNOSIS — Z800 Family history of malignant neoplasm of digestive organs: Secondary | ICD-10-CM | POA: Insufficient documentation

## 2022-12-25 DIAGNOSIS — M542 Cervicalgia: Secondary | ICD-10-CM | POA: Insufficient documentation

## 2022-12-25 DIAGNOSIS — M79672 Pain in left foot: Secondary | ICD-10-CM | POA: Insufficient documentation

## 2022-12-25 DIAGNOSIS — M545 Low back pain, unspecified: Secondary | ICD-10-CM | POA: Insufficient documentation

## 2022-12-25 DIAGNOSIS — M75101 Unspecified rotator cuff tear or rupture of right shoulder, not specified as traumatic: Secondary | ICD-10-CM | POA: Insufficient documentation

## 2022-12-25 DIAGNOSIS — K7400 Hepatic fibrosis, unspecified: Secondary | ICD-10-CM | POA: Insufficient documentation

## 2022-12-25 DIAGNOSIS — K4000 Bilateral inguinal hernia, with obstruction, without gangrene, not specified as recurrent: Secondary | ICD-10-CM | POA: Insufficient documentation

## 2022-12-25 DIAGNOSIS — M1991 Primary osteoarthritis, unspecified site: Secondary | ICD-10-CM | POA: Insufficient documentation

## 2022-12-25 DIAGNOSIS — Z9889 Other specified postprocedural states: Secondary | ICD-10-CM

## 2022-12-25 DIAGNOSIS — M7918 Myalgia, other site: Secondary | ICD-10-CM | POA: Insufficient documentation

## 2022-12-25 DIAGNOSIS — Z0389 Encounter for observation for other suspected diseases and conditions ruled out: Secondary | ICD-10-CM | POA: Insufficient documentation

## 2022-12-25 DIAGNOSIS — M5412 Radiculopathy, cervical region: Secondary | ICD-10-CM | POA: Insufficient documentation

## 2022-12-25 DIAGNOSIS — M25572 Pain in left ankle and joints of left foot: Secondary | ICD-10-CM | POA: Insufficient documentation

## 2022-12-25 DIAGNOSIS — Z87891 Personal history of nicotine dependence: Secondary | ICD-10-CM | POA: Insufficient documentation

## 2022-12-25 DIAGNOSIS — B1920 Unspecified viral hepatitis C without hepatic coma: Secondary | ICD-10-CM | POA: Insufficient documentation

## 2022-12-25 MED ORDER — LIDOCAINE 5 % EX OINT: 1.0000 | TOPICAL_OINTMENT | CUTANEOUS | 0 refills | Status: AC | PRN

## 2022-12-25 NOTE — Progress Notes (Signed)
   Chief Complaint  Patient presents with   Routine Post Op    Pt stated that he still has some numbness and discomfort     Subjective:  Patient presents today status post left peroneal tendon repair.  DOS: 10/08/2022.   He states he is doing well.  He has been weightbearing tolerating regular shoes.  Denies any other acute complaints.  Past Medical History:  Diagnosis Date   Arthritis    Hepatitis    treated for Hep C in 2018   Hypertension    white coat sydrone, home check are WNL   Pre-diabetes    White coat syndrome with diagnosis of hypertension     Past Surgical History:  Procedure Laterality Date   COLONOSCOPY W/ POLYPECTOMY     x 3   FRACTURE SURGERY     left hand ring finger   knee arthroscopic Left    SHOULDER OPEN ROTATOR CUFF REPAIR     XI ROBOTIC ASSISTED INGUINAL HERNIA REPAIR WITH MESH Bilateral 10/03/2021   Procedure: XI ROBOTIC ASSISTED INGUINAL HERNIA REPAIR WITH MESH;  Surgeon: Carolan Shiver, MD;  Location: [facility] ORS;  Service: General;  Laterality: Bilateral;    Allergies  Allergen Reactions   Diclofenac     Muscle cramps; has taken diclofenac gel, and other NSAIDs before without issue    Objective/Physical Exam Neurovascular status intact.  Incision well coapted with staples intact. No sign of infectious process noted. No dehiscence. No active bleeding noted.  Moderate edema noted to the surgical extremity.   Assessment: 1. s/p peroneal tendon repair left. DOS: 10/08/2022   Plan of Care:  - Patient is experienced some neuritis symptoms.  I discussed with the patient benefit from lidocaine jelly.  I instructed him he can take up to year to go away.  He states understanding.  He is able to return to regular activities with some restriction due to the numbness.  The strength of the peroneal tendon is intact.
